# Patient Record
Sex: Female | Born: 1995 | Race: White | Hispanic: No | Marital: Married | Smoking: Never smoker
Health system: Southern US, Community
[De-identification: ages and names within clinical notes are randomized; demographics above are authoritative.]

## PROBLEM LIST (undated history)

## (undated) DIAGNOSIS — E039 Hypothyroidism, unspecified: Secondary | ICD-10-CM

## (undated) DIAGNOSIS — R51 Headache: Secondary | ICD-10-CM

## (undated) HISTORY — PX: NOSE SURGERY: SHX723

## (undated) HISTORY — DX: Headache: R51

---
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
## 2015-03-11 ENCOUNTER — Encounter: Payer: Self-pay | Admitting: Pediatrics

## 2016-05-09 DIAGNOSIS — J343 Hypertrophy of nasal turbinates: Secondary | ICD-10-CM | POA: Diagnosis not present

## 2016-05-09 DIAGNOSIS — M950 Acquired deformity of nose: Secondary | ICD-10-CM | POA: Diagnosis not present

## 2016-05-09 DIAGNOSIS — J342 Deviated nasal septum: Secondary | ICD-10-CM | POA: Diagnosis not present

## 2016-05-11 DIAGNOSIS — J343 Hypertrophy of nasal turbinates: Secondary | ICD-10-CM | POA: Diagnosis not present

## 2016-05-11 DIAGNOSIS — J3489 Other specified disorders of nose and nasal sinuses: Secondary | ICD-10-CM | POA: Diagnosis not present

## 2016-05-11 DIAGNOSIS — S022XXS Fracture of nasal bones, sequela: Secondary | ICD-10-CM | POA: Diagnosis not present

## 2016-05-11 DIAGNOSIS — J342 Deviated nasal septum: Secondary | ICD-10-CM | POA: Diagnosis not present

## 2016-05-11 DIAGNOSIS — M950 Acquired deformity of nose: Secondary | ICD-10-CM | POA: Diagnosis not present

## 2016-12-11 HISTORY — PX: WISDOM TOOTH EXTRACTION: SHX21

## 2016-12-26 DIAGNOSIS — J019 Acute sinusitis, unspecified: Secondary | ICD-10-CM | POA: Diagnosis not present

## 2016-12-26 DIAGNOSIS — Z6824 Body mass index (BMI) 24.0-24.9, adult: Secondary | ICD-10-CM | POA: Diagnosis not present

## 2018-10-17 DIAGNOSIS — Z6825 Body mass index (BMI) 25.0-25.9, adult: Secondary | ICD-10-CM | POA: Diagnosis not present

## 2018-10-17 DIAGNOSIS — Z01419 Encounter for gynecological examination (general) (routine) without abnormal findings: Secondary | ICD-10-CM | POA: Diagnosis not present

## 2019-07-15 DIAGNOSIS — R309 Painful micturition, unspecified: Secondary | ICD-10-CM | POA: Diagnosis not present

## 2019-07-15 DIAGNOSIS — Z03818 Encounter for observation for suspected exposure to other biological agents ruled out: Secondary | ICD-10-CM | POA: Diagnosis not present

## 2019-07-15 DIAGNOSIS — R300 Dysuria: Secondary | ICD-10-CM | POA: Diagnosis not present

## 2019-07-15 DIAGNOSIS — B373 Candidiasis of vulva and vagina: Secondary | ICD-10-CM | POA: Diagnosis not present

## 2019-07-15 DIAGNOSIS — U071 COVID-19: Secondary | ICD-10-CM | POA: Diagnosis not present

## 2019-07-21 DIAGNOSIS — Z20828 Contact with and (suspected) exposure to other viral communicable diseases: Secondary | ICD-10-CM | POA: Diagnosis not present

## 2019-07-23 DIAGNOSIS — U071 COVID-19: Secondary | ICD-10-CM | POA: Diagnosis not present

## 2019-10-10 DIAGNOSIS — L700 Acne vulgaris: Secondary | ICD-10-CM | POA: Diagnosis not present

## 2019-10-10 DIAGNOSIS — D485 Neoplasm of uncertain behavior of skin: Secondary | ICD-10-CM | POA: Diagnosis not present

## 2019-10-27 DIAGNOSIS — Z113 Encounter for screening for infections with a predominantly sexual mode of transmission: Secondary | ICD-10-CM | POA: Diagnosis not present

## 2019-10-27 DIAGNOSIS — Z01419 Encounter for gynecological examination (general) (routine) without abnormal findings: Secondary | ICD-10-CM | POA: Diagnosis not present

## 2019-10-27 DIAGNOSIS — Z6825 Body mass index (BMI) 25.0-25.9, adult: Secondary | ICD-10-CM | POA: Diagnosis not present

## 2019-10-29 DIAGNOSIS — Z1321 Encounter for screening for nutritional disorder: Secondary | ICD-10-CM | POA: Diagnosis not present

## 2019-10-29 DIAGNOSIS — Z13220 Encounter for screening for lipoid disorders: Secondary | ICD-10-CM | POA: Diagnosis not present

## 2019-10-29 DIAGNOSIS — Z1329 Encounter for screening for other suspected endocrine disorder: Secondary | ICD-10-CM | POA: Diagnosis not present

## 2019-10-29 DIAGNOSIS — Z13228 Encounter for screening for other metabolic disorders: Secondary | ICD-10-CM | POA: Diagnosis not present

## 2019-12-15 DIAGNOSIS — E039 Hypothyroidism, unspecified: Secondary | ICD-10-CM | POA: Diagnosis not present

## 2020-03-20 DIAGNOSIS — Z20828 Contact with and (suspected) exposure to other viral communicable diseases: Secondary | ICD-10-CM | POA: Diagnosis not present

## 2020-03-20 DIAGNOSIS — Z03818 Encounter for observation for suspected exposure to other biological agents ruled out: Secondary | ICD-10-CM | POA: Diagnosis not present

## 2020-04-20 DIAGNOSIS — R079 Chest pain, unspecified: Secondary | ICD-10-CM | POA: Diagnosis not present

## 2020-04-20 DIAGNOSIS — Z6825 Body mass index (BMI) 25.0-25.9, adult: Secondary | ICD-10-CM | POA: Diagnosis not present

## 2020-04-20 DIAGNOSIS — K219 Gastro-esophageal reflux disease without esophagitis: Secondary | ICD-10-CM | POA: Diagnosis not present

## 2020-04-20 DIAGNOSIS — Z23 Encounter for immunization: Secondary | ICD-10-CM | POA: Diagnosis not present

## 2020-06-09 DIAGNOSIS — L739 Follicular disorder, unspecified: Secondary | ICD-10-CM | POA: Diagnosis not present

## 2020-06-09 DIAGNOSIS — L700 Acne vulgaris: Secondary | ICD-10-CM | POA: Diagnosis not present

## 2020-06-29 DIAGNOSIS — Z03818 Encounter for observation for suspected exposure to other biological agents ruled out: Secondary | ICD-10-CM | POA: Diagnosis not present

## 2020-06-29 DIAGNOSIS — Z20822 Contact with and (suspected) exposure to covid-19: Secondary | ICD-10-CM | POA: Diagnosis not present

## 2020-08-11 DIAGNOSIS — L700 Acne vulgaris: Secondary | ICD-10-CM | POA: Diagnosis not present

## 2020-09-09 DIAGNOSIS — R1313 Dysphagia, pharyngeal phase: Secondary | ICD-10-CM | POA: Diagnosis not present

## 2020-09-09 DIAGNOSIS — E039 Hypothyroidism, unspecified: Secondary | ICD-10-CM | POA: Diagnosis not present

## 2020-09-09 DIAGNOSIS — Z6826 Body mass index (BMI) 26.0-26.9, adult: Secondary | ICD-10-CM | POA: Diagnosis not present

## 2020-09-10 ENCOUNTER — Other Ambulatory Visit: Payer: Self-pay | Admitting: Nurse Practitioner

## 2020-09-10 DIAGNOSIS — R1310 Dysphagia, unspecified: Secondary | ICD-10-CM

## 2020-09-10 DIAGNOSIS — E039 Hypothyroidism, unspecified: Secondary | ICD-10-CM

## 2020-09-10 DIAGNOSIS — R1313 Dysphagia, pharyngeal phase: Secondary | ICD-10-CM

## 2020-09-17 ENCOUNTER — Other Ambulatory Visit: Payer: Self-pay

## 2020-09-23 ENCOUNTER — Ambulatory Visit
Admission: RE | Admit: 2020-09-23 | Discharge: 2020-09-23 | Disposition: A | Discharge status: Home / Self Care | Payer: BC Managed Care – PPO | Source: Ambulatory Visit | Attending: Nurse Practitioner | Admitting: Nurse Practitioner

## 2020-09-23 DIAGNOSIS — R1310 Dysphagia, unspecified: Secondary | ICD-10-CM

## 2020-09-23 DIAGNOSIS — R1313 Dysphagia, pharyngeal phase: Secondary | ICD-10-CM

## 2020-09-23 DIAGNOSIS — E039 Hypothyroidism, unspecified: Secondary | ICD-10-CM

## 2020-12-11 NOTE — L&D Delivery Note (Signed)
Delivery Note  SVD viable female Apgars 8,9 over posterior vaginal laceration.  Placenta delivered spontaneously intact with 3VC. Repair with 2-0 Chromic with good support and hemostasis noted.  R/V exam confirms.  PH art was not sent.   Mother and baby to couplet care and are doing well.  EBL 100cc  Candice Camp, MD

## 2021-03-23 LAB — OB RESULTS CONSOLE HIV ANTIBODY (ROUTINE TESTING): HIV: NONREACTIVE

## 2021-03-23 LAB — OB RESULTS CONSOLE HEPATITIS B SURFACE ANTIGEN: Hepatitis B Surface Ag: NEGATIVE

## 2021-03-23 LAB — OB RESULTS CONSOLE RUBELLA ANTIBODY, IGM: Rubella: IMMUNE

## 2021-10-04 LAB — OB RESULTS CONSOLE GBS: GBS: NEGATIVE

## 2021-10-22 ENCOUNTER — Inpatient Hospital Stay (HOSPITAL_COMMUNITY)
Admission: AD | Admit: 2021-10-22 | Discharge: 2021-10-24 | DRG: 807 | Disposition: A | Discharge status: Home / Self Care | Payer: BLUE CROSS/BLUE SHIELD | Attending: Obstetrics and Gynecology | Admitting: Obstetrics and Gynecology

## 2021-10-22 ENCOUNTER — Other Ambulatory Visit: Payer: Self-pay

## 2021-10-22 ENCOUNTER — Encounter (HOSPITAL_COMMUNITY): Payer: Self-pay | Admitting: Obstetrics and Gynecology

## 2021-10-22 ENCOUNTER — Inpatient Hospital Stay (HOSPITAL_COMMUNITY): Payer: BLUE CROSS/BLUE SHIELD | Admitting: Anesthesiology

## 2021-10-22 DIAGNOSIS — Z3689 Encounter for other specified antenatal screening: Secondary | ICD-10-CM | POA: Diagnosis not present

## 2021-10-22 DIAGNOSIS — O99284 Endocrine, nutritional and metabolic diseases complicating childbirth: Secondary | ICD-10-CM | POA: Diagnosis present

## 2021-10-22 DIAGNOSIS — Z20822 Contact with and (suspected) exposure to covid-19: Secondary | ICD-10-CM | POA: Diagnosis present

## 2021-10-22 DIAGNOSIS — Z3A39 39 weeks gestation of pregnancy: Secondary | ICD-10-CM

## 2021-10-22 DIAGNOSIS — Z3490 Encounter for supervision of normal pregnancy, unspecified, unspecified trimester: Secondary | ICD-10-CM

## 2021-10-22 DIAGNOSIS — E039 Hypothyroidism, unspecified: Secondary | ICD-10-CM | POA: Diagnosis present

## 2021-10-22 DIAGNOSIS — O26893 Other specified pregnancy related conditions, third trimester: Secondary | ICD-10-CM | POA: Diagnosis present

## 2021-10-22 HISTORY — DX: Hypothyroidism, unspecified: E03.9

## 2021-10-22 LAB — RESP PANEL BY RT-PCR (FLU A&B, COVID) ARPGX2
Influenza A by PCR: NEGATIVE
Influenza B by PCR: NEGATIVE
SARS Coronavirus 2 by RT PCR: NEGATIVE

## 2021-10-22 LAB — CBC
HCT: 33.6 % — ABNORMAL LOW (ref 36.0–46.0)
Hemoglobin: 10.6 g/dL — ABNORMAL LOW (ref 12.0–15.0)
MCH: 28.7 pg (ref 26.0–34.0)
MCHC: 31.5 g/dL (ref 30.0–36.0)
MCV: 91.1 fL (ref 80.0–100.0)
Platelets: 319 10*3/uL (ref 150–400)
RBC: 3.69 MIL/uL — ABNORMAL LOW (ref 3.87–5.11)
RDW: 12.6 % (ref 11.5–15.5)
WBC: 14.3 10*3/uL — ABNORMAL HIGH (ref 4.0–10.5)
nRBC: 0 % (ref 0.0–0.2)

## 2021-10-22 LAB — TYPE AND SCREEN
ABO/RH(D): A POS
Antibody Screen: NEGATIVE

## 2021-10-22 LAB — RPR: RPR Ser Ql: NONREACTIVE

## 2021-10-22 MED ORDER — LEVOTHYROXINE SODIUM 100 MCG PO TABS
100.0000 ug | ORAL_TABLET | Freq: Every day | ORAL | Status: DC
Start: 2021-10-23 — End: 2021-10-24
  Administered 2021-10-23 – 2021-10-24 (×2): 100 ug via ORAL
  Filled 2021-10-22 (×2): qty 1

## 2021-10-22 MED ORDER — DIPHENHYDRAMINE HCL 50 MG/ML IJ SOLN: 12.5000 mg | INTRAMUSCULAR | Status: DC | PRN

## 2021-10-22 MED ORDER — ACETAMINOPHEN 325 MG PO TABS
650.0000 mg | ORAL_TABLET | ORAL | Status: DC | PRN
  Administered 2021-10-23 (×4): 650 mg via ORAL
  Filled 2021-10-22 (×4): qty 2

## 2021-10-22 MED ORDER — OXYCODONE-ACETAMINOPHEN 5-325 MG PO TABS: 2.0000 | ORAL_TABLET | ORAL | Status: DC | PRN

## 2021-10-22 MED ORDER — IBUPROFEN 600 MG PO TABS
600.0000 mg | ORAL_TABLET | Freq: Four times a day (QID) | ORAL | Status: DC
  Administered 2021-10-22 – 2021-10-24 (×6): 600 mg via ORAL
  Filled 2021-10-22 (×6): qty 1

## 2021-10-22 MED ORDER — WITCH HAZEL-GLYCERIN EX PADS: 1.0000 "application " | MEDICATED_PAD | CUTANEOUS | Status: DC | PRN

## 2021-10-22 MED ORDER — TERBUTALINE SULFATE 1 MG/ML IJ SOLN: 0.2500 mg | Freq: Once | INTRAMUSCULAR | Status: DC | PRN

## 2021-10-22 MED ORDER — PRENATAL MULTIVITAMIN CH
1.0000 | ORAL_TABLET | Freq: Every day | ORAL | Status: DC
  Administered 2021-10-23: 1 via ORAL
  Filled 2021-10-22: qty 1

## 2021-10-22 MED ORDER — FENTANYL-BUPIVACAINE-NACL 0.5-0.125-0.9 MG/250ML-% EP SOLN
12.0000 mL/h | EPIDURAL | Status: DC | PRN
  Filled 2021-10-22: qty 250

## 2021-10-22 MED ORDER — LACTATED RINGERS IV SOLN
500.0000 mL | INTRAVENOUS | Status: DC | PRN
  Administered 2021-10-22 (×2): 500 mL via INTRAVENOUS

## 2021-10-22 MED ORDER — LIDOCAINE HCL (PF) 1 % IJ SOLN
INTRAMUSCULAR | Status: DC | PRN
Start: 2021-10-22 — End: 2021-10-22
  Administered 2021-10-22 (×2): 5 mL via EPIDURAL

## 2021-10-22 MED ORDER — MEDROXYPROGESTERONE ACETATE 150 MG/ML IM SUSP: 150.0000 mg | INTRAMUSCULAR | Status: DC | PRN

## 2021-10-22 MED ORDER — PHENYLEPHRINE 40 MCG/ML (10ML) SYRINGE FOR IV PUSH (FOR BLOOD PRESSURE SUPPORT)
80.0000 ug | PREFILLED_SYRINGE | INTRAVENOUS | Status: DC | PRN
  Filled 2021-10-22: qty 10

## 2021-10-22 MED ORDER — LIDOCAINE HCL (PF) 1 % IJ SOLN: 30.0000 mL | INTRAMUSCULAR | Status: DC | PRN

## 2021-10-22 MED ORDER — SIMETHICONE 80 MG PO CHEW: 80.0000 mg | CHEWABLE_TABLET | ORAL | Status: DC | PRN

## 2021-10-22 MED ORDER — EPHEDRINE 5 MG/ML INJ: 10.0000 mg | INTRAVENOUS | Status: DC | PRN

## 2021-10-22 MED ORDER — FLEET ENEMA 7-19 GM/118ML RE ENEM: 1.0000 | ENEMA | RECTAL | Status: DC | PRN

## 2021-10-22 MED ORDER — COCONUT OIL OIL: 1.0000 "application " | TOPICAL_OIL | Status: DC | PRN

## 2021-10-22 MED ORDER — OXYTOCIN-SODIUM CHLORIDE 30-0.9 UT/500ML-% IV SOLN: 2.5000 [IU]/h | INTRAVENOUS | Status: DC

## 2021-10-22 MED ORDER — ONDANSETRON HCL 4 MG PO TABS: 4.0000 mg | ORAL_TABLET | ORAL | Status: DC | PRN

## 2021-10-22 MED ORDER — OXYTOCIN-SODIUM CHLORIDE 30-0.9 UT/500ML-% IV SOLN
1.0000 m[IU]/min | INTRAVENOUS | Status: DC
  Administered 2021-10-22: 2 m[IU]/min via INTRAVENOUS
  Filled 2021-10-22: qty 500

## 2021-10-22 MED ORDER — BENZOCAINE-MENTHOL 20-0.5 % EX AERO
1.0000 "application " | INHALATION_SPRAY | CUTANEOUS | Status: DC | PRN
  Administered 2021-10-23: 1 via TOPICAL
  Filled 2021-10-22: qty 56

## 2021-10-22 MED ORDER — SENNOSIDES-DOCUSATE SODIUM 8.6-50 MG PO TABS
2.0000 | ORAL_TABLET | Freq: Every day | ORAL | Status: DC
  Administered 2021-10-23: 2 via ORAL
  Filled 2021-10-22: qty 2

## 2021-10-22 MED ORDER — ONDANSETRON HCL 4 MG/2ML IJ SOLN: 4.0000 mg | INTRAMUSCULAR | Status: DC | PRN

## 2021-10-22 MED ORDER — TETANUS-DIPHTH-ACELL PERTUSSIS 5-2.5-18.5 LF-MCG/0.5 IM SUSY: 0.5000 mL | PREFILLED_SYRINGE | Freq: Once | INTRAMUSCULAR | Status: DC

## 2021-10-22 MED ORDER — LACTATED RINGERS IV SOLN
500.0000 mL | Freq: Once | INTRAVENOUS | Status: AC
  Administered 2021-10-22: 500 mL via INTRAVENOUS

## 2021-10-22 MED ORDER — OXYCODONE-ACETAMINOPHEN 5-325 MG PO TABS: 1.0000 | ORAL_TABLET | ORAL | Status: DC | PRN

## 2021-10-22 MED ORDER — LACTATED RINGERS IV SOLN: INTRAVENOUS | Status: DC

## 2021-10-22 MED ORDER — SOD CITRATE-CITRIC ACID 500-334 MG/5ML PO SOLN: 30.0000 mL | ORAL | Status: DC | PRN

## 2021-10-22 MED ORDER — OXYTOCIN BOLUS FROM INFUSION
333.0000 mL | Freq: Once | INTRAVENOUS | Status: AC
  Administered 2021-10-22: 333 mL via INTRAVENOUS

## 2021-10-22 MED ORDER — FENTANYL CITRATE (PF) 100 MCG/2ML IJ SOLN: 50.0000 ug | INTRAMUSCULAR | Status: DC | PRN

## 2021-10-22 MED ORDER — ACETAMINOPHEN 325 MG PO TABS: 650.0000 mg | ORAL_TABLET | ORAL | Status: DC | PRN

## 2021-10-22 MED ORDER — OXYCODONE-ACETAMINOPHEN 5-325 MG PO TABS
1.0000 | ORAL_TABLET | ORAL | Status: DC | PRN
  Administered 2021-10-22: 1 via ORAL
  Filled 2021-10-22: qty 1

## 2021-10-22 MED ORDER — ZOLPIDEM TARTRATE 5 MG PO TABS: 5.0000 mg | ORAL_TABLET | Freq: Every evening | ORAL | Status: DC | PRN

## 2021-10-22 MED ORDER — ONDANSETRON HCL 4 MG/2ML IJ SOLN
4.0000 mg | Freq: Four times a day (QID) | INTRAMUSCULAR | Status: DC | PRN
  Administered 2021-10-22: 4 mg via INTRAVENOUS
  Filled 2021-10-22: qty 2

## 2021-10-22 MED ORDER — DIBUCAINE (PERIANAL) 1 % EX OINT: 1.0000 "application " | TOPICAL_OINTMENT | CUTANEOUS | Status: DC | PRN

## 2021-10-22 MED ORDER — MEASLES, MUMPS & RUBELLA VAC IJ SOLR: 0.5000 mL | Freq: Once | INTRAMUSCULAR | Status: DC

## 2021-10-22 MED ORDER — PHENYLEPHRINE 40 MCG/ML (10ML) SYRINGE FOR IV PUSH (FOR BLOOD PRESSURE SUPPORT): 80.0000 ug | PREFILLED_SYRINGE | INTRAVENOUS | Status: DC | PRN

## 2021-10-22 MED ORDER — DIPHENHYDRAMINE HCL 25 MG PO CAPS: 25.0000 mg | ORAL_CAPSULE | Freq: Four times a day (QID) | ORAL | Status: DC | PRN

## 2021-10-22 NOTE — Plan of Care (Signed)

## 2021-10-22 NOTE — MAU Provider Note (Signed)
Chief Complaint:  Contractions   None     HPI: Cristina Jackson is a 25 y.o. G1P0 at [redacted]w[redacted]d who presents to maternity admissions for labor evaluation. She reports contractions for 48 hours that are getting progressively stronger.  She reports good fetal movement.    She has moved to 3 hours from [place] and is concerned about long drive home if she has to return later.   HPI  Past Medical History: Past Medical History:  Diagnosis Date   Headache(784.0)    Hypothyroidism     Past obstetric history: OB History  Gravida Para Term Preterm AB Living  1            SAB IAB Ectopic Multiple Live Births               # Outcome Date GA Lbr Len/2nd Weight Sex Delivery Anes PTL Lv  1 Current             Past Surgical History: Past Surgical History:  Procedure Laterality Date   ARTHROSCOPIC REPAIR ACL  04/10/2010   Dr. Thomasena Edis   NOSE SURGERY     WISDOM TOOTH EXTRACTION  2018    Family History: Family History  Problem Relation Age of Onset   Lymphoma Paternal Grandfather    Emphysema Maternal Grandfather        Died at 33    Social History: Social History   Tobacco Use   Smoking status: Never   Smokeless tobacco: Never  Substance Use Topics   Alcohol use: No   Drug use: No    Allergies: No Known Allergies  Meds:  Medications Prior to Admission  Medication Sig Dispense Refill Last Dose   levothyroxine (SYNTHROID) 100 MCG tablet Take 100 mcg by mouth daily before breakfast.   10/21/2021   Prenatal Vit-Fe Fumarate-FA (PRENATAL MULTIVITAMIN) TABS tablet Take 1 tablet by mouth daily at 12 noon.   10/21/2021   acetaminophen (TYLENOL) 500 MG tablet Take 500 mg by mouth every 6 (six) hours as needed for headache (Take 2 by mouth every 6 hours as needed for pain.).      ibuprofen (ADVIL,MOTRIN) 200 MG tablet Take 200 mg by mouth every 6 (six) hours as needed for headache (Take 2 by mouth every 6 hours as needed for pain.).      norgestrel-ethinyl estradiol  (LO/OVRAL,CRYSELLE) 0.3-30 MG-MCG tablet Take 1 tablet by mouth daily.      ondansetron (ZOFRAN-ODT) 4 MG disintegrating tablet Take 1 tablet (4 mg total) by mouth every 8 (eight) hours as needed for nausea or vomiting. 20 tablet 0     ROS:  Review of Systems  Constitutional:  Negative for chills, fatigue and fever.  Eyes:  Negative for visual disturbance.  Respiratory:  Negative for shortness of breath.   Cardiovascular:  Negative for chest pain.  Gastrointestinal:  Positive for abdominal pain. Negative for nausea and vomiting.  Genitourinary:  Negative for difficulty urinating, dysuria, flank pain, pelvic pain, vaginal bleeding, vaginal discharge and vaginal pain.  Musculoskeletal:  Positive for back pain.  Neurological:  Negative for dizziness and headaches.  Psychiatric/Behavioral: Negative.      I have reviewed patient's Past Medical Hx, Surgical Hx, Family Hx, Social Hx, medications and allergies.   Physical Exam  Patient Vitals for the past 24 hrs:  BP Temp Temp src Pulse Resp SpO2  10/22/21 0718 (!) 88/46 -- -- 62 -- --  10/22/21 0408 118/72 -- -- 72 -- --  10/22/21 0405 -- -- -- -- --  98 %  10/22/21 0400 -- -- -- -- -- 97 %  10/22/21 0355 117/74 98.3 F (36.8 C) Oral 75 18 98 %   Constitutional: Well-developed, well-nourished female in no acute distress.  Cardiovascular: normal rate Respiratory: normal effort GI: Abd soft, non-tender, gravid appropriate for gestational age.  MS: Extremities nontender, no edema, normal ROM Neurologic: Alert and oriented x 4.  GU: Neg CVAT.  Cervix 2/70/-2 on initial exam by RN In 1.5 hours pt did not make cervical change but with more regular contractions and pt report of increased pain with contractions , plan to recheck in 1-1.5 hours.  Cervix 3/70/-2 on recheck.   Plan to recheck in 1 hour:  Dilation: 3 Effacement (%): 80 Cervical Position: Posterior Station: -2 Presentation: Vertex Exam by:: [employer] RN  FHT:   Baseline 135 , moderate variability, accelerations present, no decelerations Contractions: q 2-3 mins   Labs: No results found for this or any previous visit (from the past 24 hour(s)).    Imaging:  No results found.  MAU Course/MDM: Orders Placed This Encounter  Procedures   Resp Panel by RT-PCR (Flu A&B, Covid) Nasopharyngeal Swab   Contraction - monitoring   External fetal heart monitoring   Vaginal exam   Admit to Inpatient (patient's expected length of stay will be greater than 2 midnights or inpatient only procedure)    No orders of the defined types were placed in this encounter.    NST reviewed and reactive Pt with no cervical change on reexamination in 1.5 hours but given the long distance she is from the hospital, plan to reexamine On recheck, pt with small amount of cervical change to 3 cm but much more uncomfortable, moaning with contractions Plan to recheck in 1 hour, likely beginning active labor On recheck, pt more effaced at 80%, so called Dr Corinna Capra and pt admitted for labor, and augmentation if needed.    Fatima Blank Certified Nurse-Midwife 10/22/2021 8:22 AM

## 2021-10-22 NOTE — Lactation Note (Signed)
This note was copied from a baby's chart. Lactation Consultation Note  Patient Name: Cristina Jackson Date: 10/22/2021 Reason for consult: L&D Initial assessment;Primapara;1st time breastfeeding;Term Age:25 hours  L&D consult with >30 minutes old infant and P1 mother. Congratulated family on newborn. Baby is latched, football position, upon arrival. Transitioned to laid back position successfully. Spoonfed ~2 mL of expressed colostrum.  Discussed STS as ideal transition for infants after birth. Talked about primal reflexes. Explained LC services availability during postpartum stay. Thanked family for their time.      Maternal Data Has patient been taught Hand Expression?: Yes Does the patient have breastfeeding experience prior to this delivery?: No  Feeding Mother's Current Feeding Choice: Breast Milk  LATCH Score Latch: Grasps breast easily, tongue down, lips flanged, rhythmical sucking.  Audible Swallowing: Spontaneous and intermittent  Type of Nipple: Everted at rest and after stimulation  Comfort (Breast/Nipple): Soft / non-tender  Hold (Positioning): Assistance needed to correctly position infant at breast and maintain latch.  LATCH Score: 9  Interventions Interventions: Breast feeding basics reviewed;Assisted with latch;Skin to skin;Breast massage;Hand express;Expressed milk;Education  Discharge WIC Program: No  Consult Status Consult Status: Follow-up from L&D    [name] 10/22/2021, 8:12 PM

## 2021-10-22 NOTE — Plan of Care (Signed)

## 2021-10-22 NOTE — MAU Note (Signed)
Pt present to MAU with CTX every 6-7 mins w/ cramping since 1230 this am, pain rating 5/10. Pt denies DFM, VB, LOF, PIH s/s, and abnormal discharge. Last SVE: 1.5cm.  GBS -

## 2021-10-22 NOTE — H&P (Signed)
Cristina Jackson is a 25 y.o. female presenting for active labor.  PRegnancy uncomplicated. GBS neg. OB History     Gravida  1   Para      Term      Preterm      AB      Living         SAB      IAB      Ectopic      Multiple      Live Births             Past Medical History:  Diagnosis Date   Headache(784.0)    Hypothyroidism    Past Surgical History:  Procedure Laterality Date   ARTHROSCOPIC REPAIR ACL  04/10/2010   Dr. Thomasena Edis   NOSE SURGERY     WISDOM TOOTH EXTRACTION  2018   Family History: family history includes Emphysema in her maternal grandfather; Lymphoma in her paternal grandfather. Social History:  reports that she has never smoked. She has never used smokeless tobacco. She reports that she does not drink alcohol and does not use drugs.     Maternal Diabetes: No Genetic Screening: Normal Maternal Ultrasounds/Referrals: Normal Fetal Ultrasounds or other Referrals:  None Maternal Substance Abuse:  No Significant Maternal Medications:  None Significant Maternal Lab Results:  Group B Strep negative Other Comments:  None  Review of Systems History Dilation: 4 Effacement (%): 100 Station: -2 Exam by:: Dr. Rana Snare Blood pressure 98/63, pulse 60, temperature 98.3 F (36.8 C), temperature source Oral, resp. rate 18, SpO2 98 %. Exam Physical Exam  Vitals and nursing note reviewed. Exam conducted with a chaperone present.  Constitutional:      Appearance: Normal appearance.  HENT:     Head: Normocephalic.  Eyes:     Pupils: Pupils are equal, round, and reactive to light.  Cardiovascular:     Rate and Rhythm: Normal rate and regular rhythm.     Pulses: Normal pulses.  Abdominal:     General: Abdomen is Gravid, nontender Neurological:     Mental Status: She is alert.  Prenatal labs: ABO, Rh:   Antibody:   Rubella:   RPR:    HBsAg:    HIV:    GBS:     Assessment/Plan: IUP at term Active labor AROM and anticipate CLEA and  SVD   Turner Daniels 10/22/2021, 9:26 AM

## 2021-10-22 NOTE — Anesthesia Procedure Notes (Addendum)
Epidural Patient location during procedure: OB Start time: 10/22/2021 10:11 AM End time: 10/22/2021 10:29 AM  Staffing Anesthesiologist: Lucretia Kern, MD Performed: anesthesiologist   Preanesthetic Checklist Completed: patient identified, IV checked, risks and benefits discussed, monitors and equipment checked, pre-op evaluation and timeout performed  Epidural Patient position: sitting Prep: DuraPrep Patient monitoring: heart rate, continuous pulse ox and blood pressure Approach: midline Location: L3-L4 Injection technique: LOR air  Needle:  Needle type: Tuohy  Needle gauge: 17 G Needle length: 9 cm Needle insertion depth: 5 cm Catheter type: closed end flexible Catheter size: 19 Gauge Catheter at skin depth: 10 cm Test dose: negative  Assessment Events: blood aspirated, injection not painful, no injection resistance, no paresthesia and negative IV test  Additional Notes Per patient she has been diagnosed with mild scoliosis on physical exam. During my palpation, her lumbar spine felt left of the apparent midline. Initial attempt with smooth needle placement and crisp LOR. The needle was bolused with 1% lidocaine and patient complained of crampy back and abdominal pain. The catheter threaded very easily, but I was able to aspirate serosanguinous fluid. I removed the catheter and reattempted at a slightly different entry point. Again smooth needle placement and crisp LOR. Catheter threaded easily and no complaints from patient. No fluid aspirated via catheter. Bolused in incremental doses with no adverse effects.Reason for block:procedure for pain

## 2021-10-22 NOTE — Anesthesia Preprocedure Evaluation (Signed)
Anesthesia Evaluation  Patient identified by MRN, date of birth, ID band Patient awake    Reviewed: Allergy & Precautions, H&P , NPO status , Patient's Chart, lab work & pertinent test results  History of Anesthesia Complications Negative for: history of anesthetic complications  Airway Mallampati: II  TM Distance: >3 FB     Dental   Pulmonary neg pulmonary ROS,    Pulmonary exam normal        Cardiovascular negative cardio ROS   Rhythm:regular Rate:Normal     Neuro/Psych negative neurological ROS  negative psych ROS   GI/Hepatic negative GI ROS, Neg liver ROS,   Endo/Other  Hypothyroidism   Renal/GU negative Renal ROS  negative genitourinary   Musculoskeletal scoliosis   Abdominal   Peds  Hematology negative hematology ROS (+)   Anesthesia Other Findings   Reproductive/Obstetrics (+) Pregnancy                             Anesthesia Physical Anesthesia Plan  ASA: 2  Anesthesia Plan: Epidural   Post-op Pain Management:    Induction:   PONV Risk Score and Plan:   Airway Management Planned:   Additional Equipment:   Intra-op Plan:   Post-operative Plan:   Informed Consent: I have reviewed the patients History and Physical, chart, labs and discussed the procedure including the risks, benefits and alternatives for the proposed anesthesia with the patient or authorized representative who has indicated his/her understanding and acceptance.       Plan Discussed with:   Anesthesia Plan Comments:         Anesthesia Quick Evaluation

## 2021-10-23 LAB — CBC
HCT: 28.2 % — ABNORMAL LOW (ref 36.0–46.0)
Hemoglobin: 9.4 g/dL — ABNORMAL LOW (ref 12.0–15.0)
MCH: 29.7 pg (ref 26.0–34.0)
MCHC: 33.3 g/dL (ref 30.0–36.0)
MCV: 89 fL (ref 80.0–100.0)
Platelets: 240 10*3/uL (ref 150–400)
RBC: 3.17 MIL/uL — ABNORMAL LOW (ref 3.87–5.11)
RDW: 12.6 % (ref 11.5–15.5)
WBC: 16.5 10*3/uL — ABNORMAL HIGH (ref 4.0–10.5)
nRBC: 0 % (ref 0.0–0.2)

## 2021-10-23 NOTE — Progress Notes (Signed)
Post Partum Day 1 Subjective: no complaints, up ad lib, voiding, and tolerating PO  Objective: Blood pressure 104/74, pulse 71, temperature 98.2 F (36.8 C), temperature source Oral, resp. rate 18, height 5\' 4"  (1.626 m), weight 73.5 kg, SpO2 97 %, unknown if currently breastfeeding.  Physical Exam:  General: alert, cooperative, and appears stated age Lochia: appropriate Uterine Fundus: firm Incision:   DVT Evaluation: No evidence of DVT seen on physical exam.  Recent Labs    10/22/21 0906 10/23/21 0427  HGB 10.6* 9.4*  HCT 33.6* 28.2*    Assessment/Plan: Plan for discharge tomorrow and Breastfeeding   LOS: 1 day   10/25/21 10/23/2021, 9:55 AM

## 2021-10-23 NOTE — Lactation Note (Signed)
This note was copied from a baby's chart. Lactation Consultation Note  Patient Name: Cristina Jackson [id number] Date: 10/23/2021 Reason for consult: Initial assessment;Primapara;1st time breastfeeding;Term Age:25 hours   P1 mother whose infant is now 42 hours old.  This is a term baby at 39+1 weeks.  RN in room when I arrived.  Mother just finished breast feeding.  No questions at this time.  Asked her to call me back as the day progresses for education and to assist with latching.  Mother receptive.   Maternal Data    Feeding Mother's Current Feeding Choice: Breast Milk  LATCH Score                    Lactation Tools Discussed/Used    Interventions    Discharge    Consult Status Consult Status: Follow-up Date: 10/24/21 Follow-up type: In-patient    Cristina Jackson 10/23/2021, 5:29 AM

## 2021-10-23 NOTE — Anesthesia Postprocedure Evaluation (Signed)
Anesthesia Post Note  Patient: OLEDA BORSKI  Procedure(s) Performed: AN AD HOC LABOR EPIDURAL     Patient location during evaluation: Mother Baby Anesthesia Type: Epidural Level of consciousness: awake, oriented and awake and alert Pain management: pain level controlled Vital Signs Assessment: post-procedure vital signs reviewed and stable Respiratory status: spontaneous breathing, respiratory function stable and nonlabored ventilation Cardiovascular status: stable Postop Assessment: no headache, adequate PO intake, able to ambulate, patient able to bend at knees, no backache and no apparent nausea or vomiting Anesthetic complications: no   No notable events documented.  Last Vitals:  Vitals:   10/23/21 0200 10/23/21 0525  BP: 91/61 104/74  Pulse: 65 71  Resp: 15 18  Temp: 36.9 C 36.8 C  SpO2: 97% 97%    Last Pain:  Vitals:   10/23/21 0525  TempSrc: Oral  PainSc: 3    Pain Goal: Patients Stated Pain Goal: 0 (10/22/21 1346)                 Lacoya Wilbanks

## 2021-10-24 NOTE — Lactation Note (Signed)
This note was copied from a baby's chart. Lactation Consultation Note  Patient Name: Girl Adonai Helzer [id number] Date: 10/24/2021 Reason for consult: Follow-up assessment;Term;Primapara;1st time breastfeeding Age:25 hours   P1 mother whose infant is now 54 hours old.  This is a term baby at 39+1 weeks.  Mother was finishing a breast feeding session with baby "Rennis Chris" when I arrived.  She appeared to have a good wide gape, flanged lips and was actively sucking.  Mother denied pain and feels like breast feeding is going well.  Last LATCH score-9 ; voiding/stooling well.  Manual pump given with instructions for use.  #24 flange size is appropriate.  She will continue to feed on cue and will call our OP number with any concerns after discharge.  Mother will work from home and has a DEBP for home use.  Father and visitor present.   Maternal Data    Feeding Mother's Current Feeding Choice: Breast Milk  LATCH Score                    Lactation Tools Discussed/Used    Interventions Interventions: Education  Discharge Discharge Education: Engorgement and breast care Pump: Personal  Consult Status Consult Status: Complete Date: 10/24/21 Follow-up type: Call as needed    Irene Pap Abshir Paolini 10/24/2021, 9:31 AM

## 2021-10-24 NOTE — Discharge Summary (Signed)
Postpartum Discharge Summary  Date of Service October 24, 2021     Patient Name: Cristina Jackson DOB: November 24, 1996 MRN: [id number]  Date of admission: 10/22/2021 Delivery date:10/22/2021  Delivering provider: Louretta Shorten  Date of discharge: 10/24/2021  Admitting diagnosis: Normal labor [O80, Z37.9] Pregnancy [Z34.90] Intrauterine pregnancy: [redacted]w[redacted]d    Secondary diagnosis:  Active Problems:   Normal labor   Pregnancy  Additional problems: none    Discharge diagnosis: Term Pregnancy Delivered                                              Post partum procedures: not applicable Augmentation: AROM and Pitocin Complications: None  Hospital course: Onset of Labor With Vaginal Delivery      25y.o. yo G1P1001 at 318w1das admitted in Active Labor on 10/22/2021. Patient had an uncomplicated labor course as follows:  Membrane Rupture Time/Date: 9:24 AM ,10/22/2021   Delivery Method:Vaginal, Spontaneous  Episiotomy: None  Lacerations:  Vaginal  Patient had an uncomplicated postpartum course.  She is ambulating, tolerating a regular diet, passing flatus, and urinating well. Patient is discharged home in stable condition on 10/24/21.  Newborn Data: Birth date:10/22/2021  Birth time:7:23 PM  Gender:Female  Living status:Living  Apgars:8 ,9  Weight:3500 g   Magnesium Sulfate received: No BMZ received: No Rhophylac:N/A MMR:N/A T-DaP:Given prenatally Flu: N/A Transfusion:No  Physical exam  Vitals:   10/23/21 1015 10/23/21 1408 10/23/21 2112 10/24/21 0525  BP: 98/70 112/79 104/72 101/69  Pulse: 77 72 82 (!) 58  Resp: 20  18 18   Temp: 98 F (36.7 C) (!) 97.5 F (36.4 C) 97.7 F (36.5 C) 98.8 F (37.1 C)  TempSrc: Oral Oral Oral Oral  SpO2: 96% 99% 99% 99%  Weight:      Height:       General: alert, cooperative, and no distress Lochia: appropriate Uterine Fundus: firm Incision: Healing well with no significant drainage DVT Evaluation: No evidence of DVT seen on  physical exam. Labs: Lab Results  Component Value Date   WBC 16.5 (H) 10/23/2021   HGB 9.4 (L) 10/23/2021   HCT 28.2 (L) 10/23/2021   MCV 89.0 10/23/2021   PLT 240 10/23/2021   No flowsheet data found. Edinburgh Score: Edinburgh Postnatal Depression Scale Screening Tool 10/23/2021  I have been able to laugh and see the funny side of things. 0  I have looked forward with enjoyment to things. 0  I have blamed myself unnecessarily when things went wrong. 2  I have been anxious or worried for no good reason. 2  I have felt scared or panicky for no good reason. 1  Things have been getting on top of me. 0  I have been so unhappy that I have had difficulty sleeping. 0  I have felt sad or miserable. 0  I have been so unhappy that I have been crying. 1  The thought of harming myself has occurred to me. 0  Edinburgh Postnatal Depression Scale Total 6      After visit meds:  Allergies as of 10/24/2021   No Known Allergies      Medication List     STOP taking these medications    ondansetron 4 MG disintegrating tablet Commonly known as: ZOFRAN-ODT       TAKE these medications    acetaminophen 500 MG tablet Commonly known  as: TYLENOL Take 500 mg by mouth every 6 (six) hours as needed for headache (Take 2 by mouth every 6 hours as needed for pain.).   ibuprofen 200 MG tablet Commonly known as: ADVIL Take 200 mg by mouth every 6 (six) hours as needed for headache (Take 2 by mouth every 6 hours as needed for pain.).   levothyroxine 100 MCG tablet Commonly known as: SYNTHROID Take 100 mcg by mouth daily before breakfast.   prenatal multivitamin Tabs tablet Take 1 tablet by mouth daily at 12 noon.         Discharge home in stable condition Infant Feeding: Breast Infant Disposition:home with mother Discharge instruction: per After Visit Summary and Postpartum booklet. Activity: Advance as tolerated. Pelvic rest for 6 weeks.  Diet: routine diet Anticipated Birth  Control: Unsure Postpartum Appointment:6 weeks Additional Postpartum F/U:  not applicable Future Appointments:No future appointments. Follow up Visit:      10/24/2021 Cyril Mourning, MD

## 2021-10-31 ENCOUNTER — Inpatient Hospital Stay (HOSPITAL_COMMUNITY)
Admission: AD | Admit: 2021-10-31 | Payer: BC Managed Care – PPO | Source: Home / Self Care | Admitting: Obstetrics & Gynecology

## 2021-10-31 ENCOUNTER — Inpatient Hospital Stay (HOSPITAL_COMMUNITY): Payer: BC Managed Care – PPO

## 2021-11-02 ENCOUNTER — Telehealth (HOSPITAL_COMMUNITY): Payer: Self-pay | Admitting: *Deleted

## 2021-11-02 NOTE — Telephone Encounter (Signed)
Phone voicemail message left to return nurse call.  Duffy Rhody, RN 11-02-2021 at 9:40am

## 2021-12-22 IMAGING — US US THYROID
1 series · 14 of 25 positions shown · non-contrast
Comparison: None.

CLINICAL DATA: Dysphagia.

EXAM:
THYROID ULTRASOUND
TECHNIQUE: Ultrasound examination of the thyroid gland and adjacent soft
tissues was performed.

[Series 1: us thyroid · 0.05mm/px · 14 of 47 slices shown]
[im 1/47]
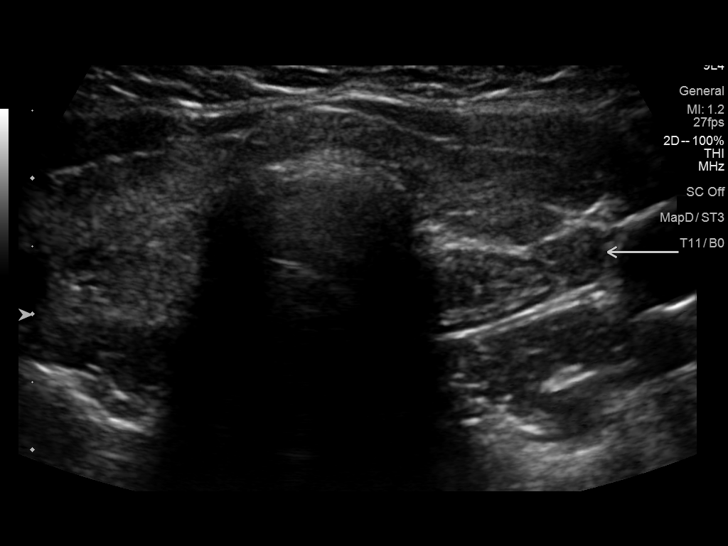
[im 4/47]
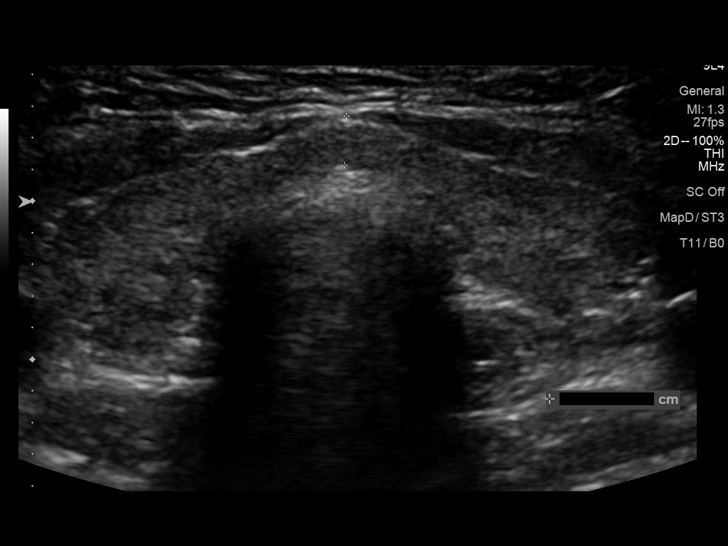
[im 8/47]
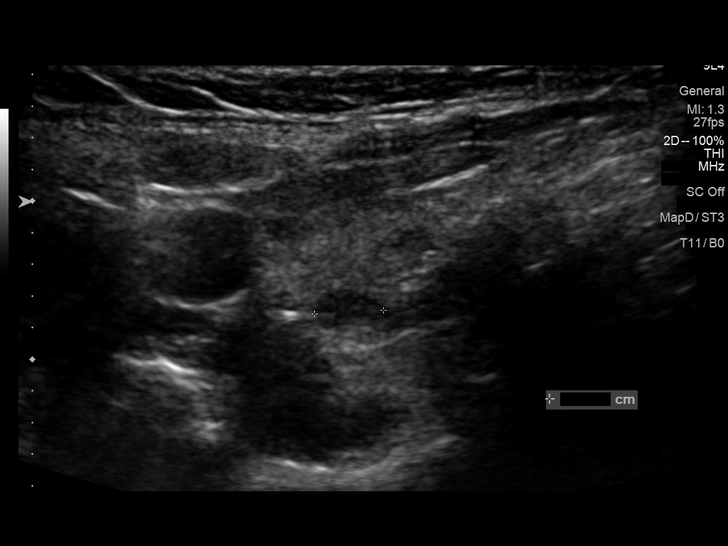
[im 12/47]
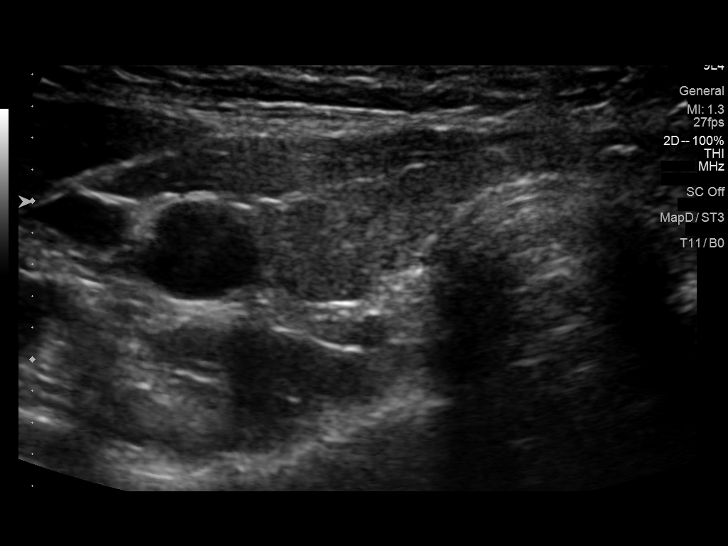
[im 16/47]
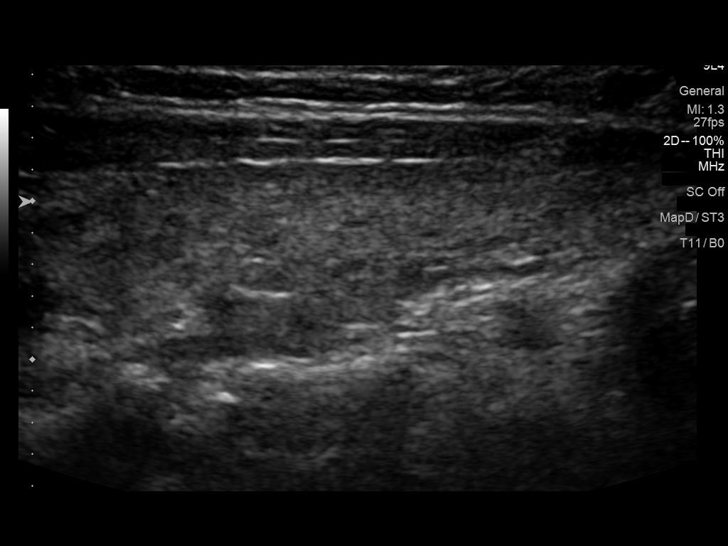
[im 18/47]
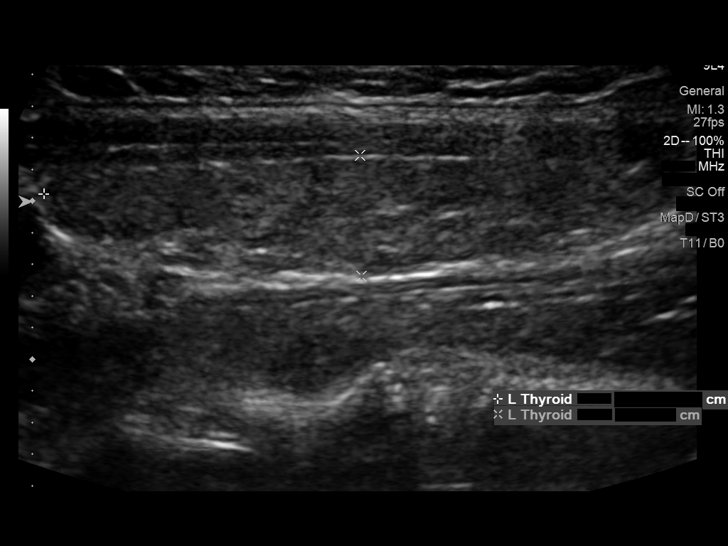
[im 22/47]
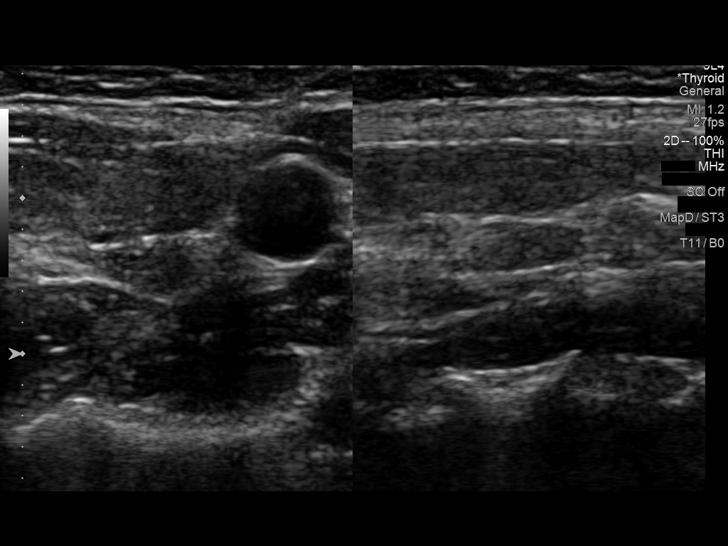
[im 25/47]
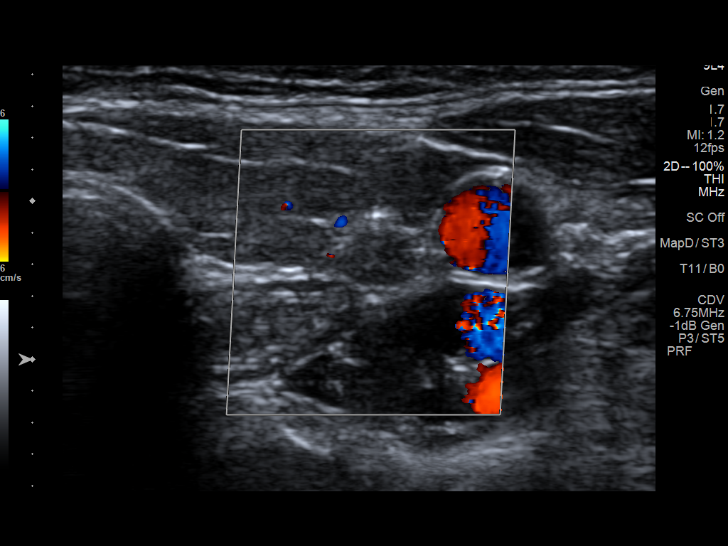
[im 29/47]
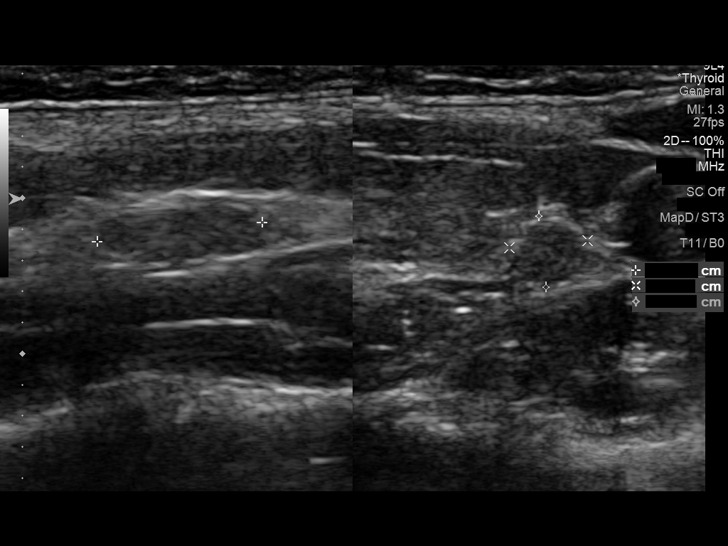
[im 31/47]
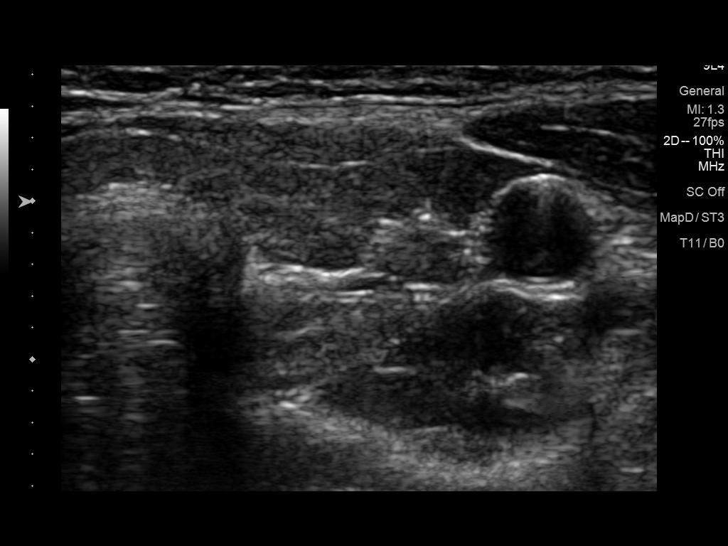
[im 35/47]
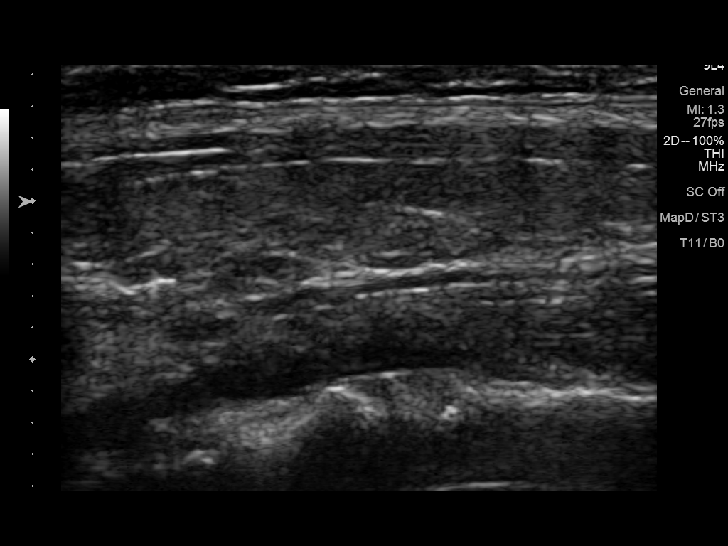
[im 39/47]
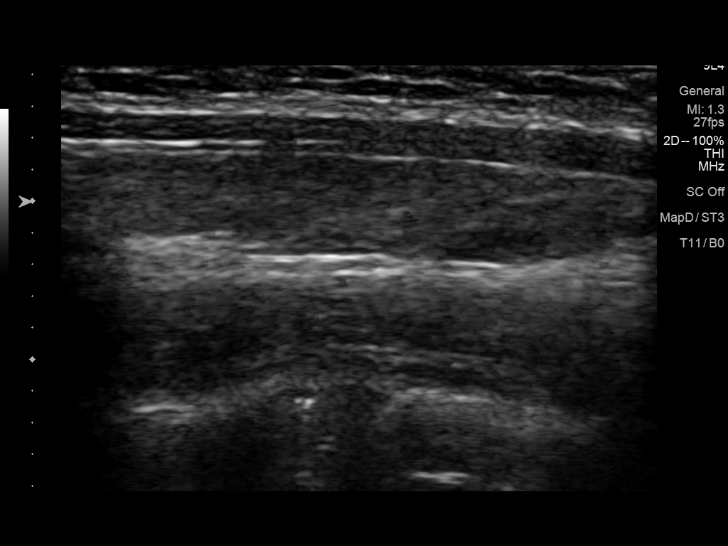
[im 43/47]
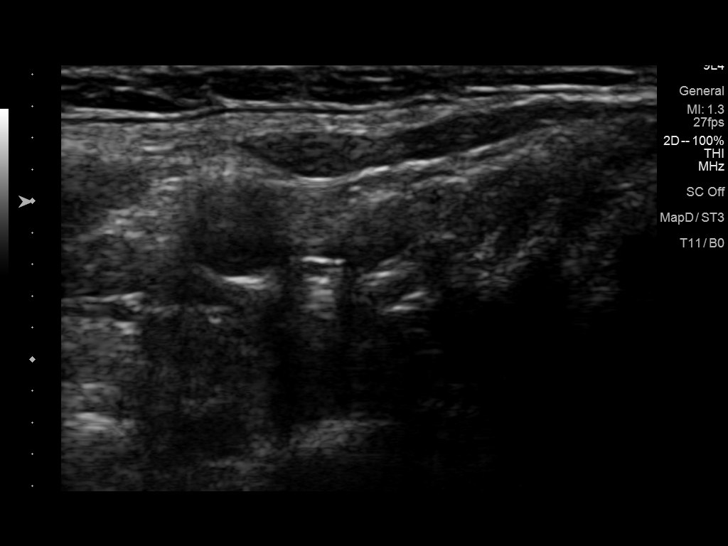
[im 47/47]
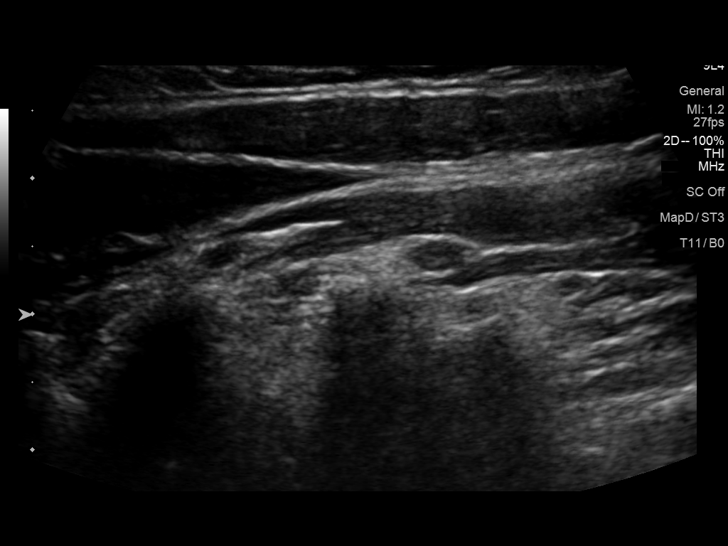

[14 of 25 positions shown; findings below may reference images not displayed]

FINDINGS: Parenchymal Echotexture: Moderately heterogenous

Isthmus: 0.3 cm

Right lobe: 5 x 1 x 1.3 cm

Left lobe: 4 x 0.8 x 1.5 cm

_________________________________________________________

Estimated total number of nodules >/= 1 cm: 1

Number of spongiform nodules >/=  2 cm not described below (TR1): 0

Number of mixed cystic and solid nodules >/= 1.5 cm not described
below (TR2): 0

_________________________________________________________

While the technologist labeled 2 potential nodules on the left, on
the provided cine clips, these nodules appear to represent pseudo
nodules secondary to the heterogeneous thyroid parenchyma. As such,
no distinct convincing thyroid nodules were identified on this
study.
IMPRESSION: Moderately heterogeneous thyroid gland without evidence for distinct
thyroid nodule.

The above is in keeping with the ACR TI-RADS recommendations - [HOSPITAL] 0947;[DATE].

## 2022-09-08 LAB — OB RESULTS CONSOLE HIV ANTIBODY (ROUTINE TESTING): HIV: NONREACTIVE

## 2022-09-08 LAB — OB RESULTS CONSOLE ANTIBODY SCREEN: Antibody Screen: NEGATIVE

## 2022-09-08 LAB — OB RESULTS CONSOLE ABO/RH: RH Type: POSITIVE

## 2022-09-08 LAB — HEPATITIS C ANTIBODY: HCV Ab: NEGATIVE

## 2022-09-08 LAB — OB RESULTS CONSOLE RPR: RPR: NONREACTIVE

## 2022-09-08 LAB — OB RESULTS CONSOLE RUBELLA ANTIBODY, IGM: Rubella: IMMUNE

## 2022-09-08 LAB — OB RESULTS CONSOLE HEPATITIS B SURFACE ANTIGEN: Hepatitis B Surface Ag: NEGATIVE

## 2022-09-22 LAB — OB RESULTS CONSOLE GC/CHLAMYDIA
Chlamydia: NEGATIVE
Neisseria Gonorrhea: NEGATIVE

## 2022-12-11 NOTE — L&D Delivery Note (Signed)
Delivery Note At 10:20 AM a viable female was delivered via  OA presentation apgars 9 and 9 weight  pending .   Placenta status: spontaneously with 3 vessel cord  ,  .  Cord:   with the following complications:  none.  Cord pH: not obtained  Anesthesia: Epidural Episiotomy:  none Lacerations:  none Suture Repair:  not applicable Est. Blood Loss (mL):  300  Mom to postpartum.  Baby to Couplet care / Skin to Skin.  Jeani Hawking 04/07/2023, 10:27 AM

## 2023-03-23 LAB — OB RESULTS CONSOLE GBS: GBS: POSITIVE

## 2023-04-06 ENCOUNTER — Other Ambulatory Visit: Payer: Self-pay

## 2023-04-06 ENCOUNTER — Encounter (HOSPITAL_COMMUNITY): Payer: Self-pay | Admitting: *Deleted

## 2023-04-06 ENCOUNTER — Encounter (HOSPITAL_COMMUNITY): Payer: Self-pay | Admitting: Obstetrics and Gynecology

## 2023-04-06 ENCOUNTER — Telehealth (HOSPITAL_COMMUNITY): Payer: Self-pay | Admitting: *Deleted

## 2023-04-06 ENCOUNTER — Inpatient Hospital Stay (HOSPITAL_COMMUNITY)
Admission: RE | Admit: 2023-04-06 | Discharge: 2023-04-08 | DRG: 807 | Disposition: A | Discharge status: Home / Self Care | Payer: Commercial Managed Care - PPO | Attending: Family Medicine | Admitting: Family Medicine

## 2023-04-06 DIAGNOSIS — O99824 Streptococcus B carrier state complicating childbirth: Principal | ICD-10-CM | POA: Diagnosis present

## 2023-04-06 DIAGNOSIS — Z3A39 39 weeks gestation of pregnancy: Secondary | ICD-10-CM | POA: Diagnosis not present

## 2023-04-06 DIAGNOSIS — E039 Hypothyroidism, unspecified: Secondary | ICD-10-CM | POA: Diagnosis present

## 2023-04-06 DIAGNOSIS — O26893 Other specified pregnancy related conditions, third trimester: Secondary | ICD-10-CM | POA: Diagnosis present

## 2023-04-06 DIAGNOSIS — Z3490 Encounter for supervision of normal pregnancy, unspecified, unspecified trimester: Principal | ICD-10-CM

## 2023-04-06 DIAGNOSIS — O99284 Endocrine, nutritional and metabolic diseases complicating childbirth: Secondary | ICD-10-CM | POA: Diagnosis present

## 2023-04-06 LAB — CBC
HCT: 33.8 % — ABNORMAL LOW (ref 36.0–46.0)
Hemoglobin: 10.7 g/dL — ABNORMAL LOW (ref 12.0–15.0)
MCH: 29.4 pg (ref 26.0–34.0)
MCHC: 31.7 g/dL (ref 30.0–36.0)
MCV: 92.9 fL (ref 80.0–100.0)
Platelets: 274 10*3/uL (ref 150–400)
RBC: 3.64 MIL/uL — ABNORMAL LOW (ref 3.87–5.11)
RDW: 13.7 % (ref 11.5–15.5)
WBC: 10.8 10*3/uL — ABNORMAL HIGH (ref 4.0–10.5)
nRBC: 0 % (ref 0.0–0.2)

## 2023-04-06 LAB — TYPE AND SCREEN
ABO/RH(D): A POS
Antibody Screen: NEGATIVE

## 2023-04-06 LAB — HIV ANTIBODY (ROUTINE TESTING W REFLEX): HIV Screen 4th Generation wRfx: NONREACTIVE

## 2023-04-06 MED ORDER — TERBUTALINE SULFATE 1 MG/ML IJ SOLN: 0.2500 mg | Freq: Once | INTRAMUSCULAR | Status: DC | PRN

## 2023-04-06 MED ORDER — OXYTOCIN-SODIUM CHLORIDE 30-0.9 UT/500ML-% IV SOLN: 2.5000 [IU]/h | INTRAVENOUS | Status: DC

## 2023-04-06 MED ORDER — OXYTOCIN-SODIUM CHLORIDE 30-0.9 UT/500ML-% IV SOLN
1.0000 m[IU]/min | INTRAVENOUS | Status: DC
  Administered 2023-04-07: 2 m[IU]/min via INTRAVENOUS
  Filled 2023-04-06: qty 500

## 2023-04-06 MED ORDER — SOD CITRATE-CITRIC ACID 500-334 MG/5ML PO SOLN
30.0000 mL | ORAL | Status: DC | PRN
  Administered 2023-04-06 – 2023-04-07 (×2): 30 mL via ORAL
  Filled 2023-04-06 (×2): qty 30

## 2023-04-06 MED ORDER — OXYCODONE-ACETAMINOPHEN 5-325 MG PO TABS: 2.0000 | ORAL_TABLET | ORAL | Status: DC | PRN

## 2023-04-06 MED ORDER — LACTATED RINGERS IV SOLN: 500.0000 mL | INTRAVENOUS | Status: DC | PRN

## 2023-04-06 MED ORDER — MISOPROSTOL 25 MCG QUARTER TABLET
25.0000 ug | ORAL_TABLET | Freq: Once | ORAL | Status: AC
  Administered 2023-04-06: 25 ug via VAGINAL
  Filled 2023-04-06: qty 1

## 2023-04-06 MED ORDER — ONDANSETRON HCL 4 MG/2ML IJ SOLN: 4.0000 mg | Freq: Four times a day (QID) | INTRAMUSCULAR | Status: DC | PRN

## 2023-04-06 MED ORDER — ACETAMINOPHEN 325 MG PO TABS: 650.0000 mg | ORAL_TABLET | ORAL | Status: DC | PRN

## 2023-04-06 MED ORDER — OXYTOCIN BOLUS FROM INFUSION
333.0000 mL | Freq: Once | INTRAVENOUS | Status: AC
  Administered 2023-04-07: 333 mL via INTRAVENOUS

## 2023-04-06 MED ORDER — OXYCODONE-ACETAMINOPHEN 5-325 MG PO TABS: 1.0000 | ORAL_TABLET | ORAL | Status: DC | PRN

## 2023-04-06 MED ORDER — LACTATED RINGERS IV SOLN: INTRAVENOUS | Status: DC

## 2023-04-06 MED ORDER — SODIUM CHLORIDE 0.9 % IV SOLN
2.0000 g | Freq: Once | INTRAVENOUS | Status: AC
  Administered 2023-04-06: 2 g via INTRAVENOUS
  Filled 2023-04-06: qty 2000

## 2023-04-06 MED ORDER — SODIUM CHLORIDE 0.9 % IV SOLN
1.0000 g | INTRAVENOUS | Status: DC
  Administered 2023-04-06 – 2023-04-07 (×3): 1 g via INTRAVENOUS
  Filled 2023-04-06 (×3): qty 1000

## 2023-04-06 MED ORDER — LIDOCAINE HCL (PF) 1 % IJ SOLN: 30.0000 mL | INTRAMUSCULAR | Status: DC | PRN

## 2023-04-06 MED ORDER — MISOPROSTOL 25 MCG QUARTER TABLET
25.0000 ug | ORAL_TABLET | Freq: Once | ORAL | Status: AC
  Administered 2023-04-06: 25 ug via ORAL
  Filled 2023-04-06: qty 1

## 2023-04-06 NOTE — Telephone Encounter (Signed)
Preadmission screen  

## 2023-04-06 NOTE — Progress Notes (Signed)
Assume care of patient

## 2023-04-07 ENCOUNTER — Inpatient Hospital Stay (HOSPITAL_COMMUNITY): Payer: Commercial Managed Care - PPO | Admitting: Anesthesiology

## 2023-04-07 ENCOUNTER — Encounter (HOSPITAL_COMMUNITY): Payer: Self-pay | Admitting: Obstetrics and Gynecology

## 2023-04-07 LAB — RPR: RPR Ser Ql: NONREACTIVE

## 2023-04-07 MED ORDER — LIDOCAINE HCL (PF) 1 % IJ SOLN
INTRAMUSCULAR | Status: DC | PRN
  Administered 2023-04-07: 11 mL via EPIDURAL

## 2023-04-07 MED ORDER — ACETAMINOPHEN 325 MG PO TABS
650.0000 mg | ORAL_TABLET | ORAL | Status: DC | PRN
  Administered 2023-04-07 (×2): 650 mg via ORAL
  Filled 2023-04-07 (×2): qty 2

## 2023-04-07 MED ORDER — EPHEDRINE 5 MG/ML INJ: 10.0000 mg | INTRAVENOUS | Status: DC | PRN

## 2023-04-07 MED ORDER — COCONUT OIL OIL: 1.0000 | TOPICAL_OIL | Status: DC | PRN

## 2023-04-07 MED ORDER — TETANUS-DIPHTH-ACELL PERTUSSIS 5-2.5-18.5 LF-MCG/0.5 IM SUSY: 0.5000 mL | PREFILLED_SYRINGE | Freq: Once | INTRAMUSCULAR | Status: DC

## 2023-04-07 MED ORDER — LACTATED RINGERS IV SOLN
500.0000 mL | Freq: Once | INTRAVENOUS | Status: AC
  Administered 2023-04-07: 500 mL via INTRAVENOUS

## 2023-04-07 MED ORDER — IBUPROFEN 600 MG PO TABS
600.0000 mg | ORAL_TABLET | Freq: Four times a day (QID) | ORAL | Status: DC
  Administered 2023-04-07 – 2023-04-08 (×4): 600 mg via ORAL
  Filled 2023-04-07 (×4): qty 1

## 2023-04-07 MED ORDER — DIPHENHYDRAMINE HCL 50 MG/ML IJ SOLN: 12.5000 mg | INTRAMUSCULAR | Status: DC | PRN

## 2023-04-07 MED ORDER — LEVOTHYROXINE SODIUM 100 MCG PO TABS
100.0000 ug | ORAL_TABLET | Freq: Every day | ORAL | Status: DC
  Administered 2023-04-08: 100 ug via ORAL
  Filled 2023-04-07: qty 1

## 2023-04-07 MED ORDER — ZOLPIDEM TARTRATE 5 MG PO TABS: 5.0000 mg | ORAL_TABLET | Freq: Every evening | ORAL | Status: DC | PRN

## 2023-04-07 MED ORDER — DIPHENHYDRAMINE HCL 25 MG PO CAPS: 25.0000 mg | ORAL_CAPSULE | Freq: Four times a day (QID) | ORAL | Status: DC | PRN

## 2023-04-07 MED ORDER — ONDANSETRON HCL 4 MG/2ML IJ SOLN: 4.0000 mg | INTRAMUSCULAR | Status: DC | PRN

## 2023-04-07 MED ORDER — MEASLES, MUMPS & RUBELLA VAC IJ SOLR: 0.5000 mL | Freq: Once | INTRAMUSCULAR | Status: DC

## 2023-04-07 MED ORDER — BISACODYL 10 MG RE SUPP: 10.0000 mg | Freq: Every day | RECTAL | Status: DC | PRN

## 2023-04-07 MED ORDER — PRENATAL MULTIVITAMIN CH
1.0000 | ORAL_TABLET | Freq: Every day | ORAL | Status: DC
  Administered 2023-04-08: 1 via ORAL
  Filled 2023-04-07: qty 1

## 2023-04-07 MED ORDER — DIBUCAINE (PERIANAL) 1 % EX OINT: 1.0000 | TOPICAL_OINTMENT | CUTANEOUS | Status: DC | PRN

## 2023-04-07 MED ORDER — PANTOPRAZOLE SODIUM 40 MG IV SOLR
40.0000 mg | Freq: Once | INTRAVENOUS | Status: AC
  Administered 2023-04-07: 40 mg via INTRAVENOUS
  Filled 2023-04-07: qty 10

## 2023-04-07 MED ORDER — WITCH HAZEL-GLYCERIN EX PADS
1.0000 | MEDICATED_PAD | CUTANEOUS | Status: DC | PRN
  Administered 2023-04-07: 1 via TOPICAL

## 2023-04-07 MED ORDER — OXYCODONE HCL 5 MG PO TABS: 5.0000 mg | ORAL_TABLET | ORAL | Status: DC | PRN

## 2023-04-07 MED ORDER — SENNOSIDES-DOCUSATE SODIUM 8.6-50 MG PO TABS
2.0000 | ORAL_TABLET | Freq: Every day | ORAL | Status: DC
  Administered 2023-04-08: 2 via ORAL
  Filled 2023-04-07: qty 2

## 2023-04-07 MED ORDER — BENZOCAINE-MENTHOL 20-0.5 % EX AERO
1.0000 | INHALATION_SPRAY | CUTANEOUS | Status: DC | PRN
  Filled 2023-04-07: qty 56

## 2023-04-07 MED ORDER — FENTANYL-BUPIVACAINE-NACL 0.5-0.125-0.9 MG/250ML-% EP SOLN
12.0000 mL/h | EPIDURAL | Status: DC | PRN
  Administered 2023-04-07: 12 mL/h via EPIDURAL
  Filled 2023-04-07: qty 250

## 2023-04-07 MED ORDER — OXYCODONE HCL 5 MG PO TABS: 10.0000 mg | ORAL_TABLET | ORAL | Status: DC | PRN

## 2023-04-07 MED ORDER — PHENYLEPHRINE 80 MCG/ML (10ML) SYRINGE FOR IV PUSH (FOR BLOOD PRESSURE SUPPORT): 80.0000 ug | PREFILLED_SYRINGE | INTRAVENOUS | Status: DC | PRN

## 2023-04-07 MED ORDER — FENTANYL CITRATE (PF) 100 MCG/2ML IJ SOLN
100.0000 ug | INTRAMUSCULAR | Status: DC | PRN
  Administered 2023-04-07 (×2): 100 ug via INTRAVENOUS
  Filled 2023-04-07 (×2): qty 2

## 2023-04-07 MED ORDER — ONDANSETRON HCL 4 MG PO TABS: 4.0000 mg | ORAL_TABLET | ORAL | Status: DC | PRN

## 2023-04-07 MED ORDER — SIMETHICONE 80 MG PO CHEW: 80.0000 mg | CHEWABLE_TABLET | ORAL | Status: DC | PRN

## 2023-04-07 MED ORDER — MEDROXYPROGESTERONE ACETATE 150 MG/ML IM SUSP: 150.0000 mg | INTRAMUSCULAR | Status: DC | PRN

## 2023-04-07 MED ORDER — FLEET ENEMA 7-19 GM/118ML RE ENEM: 1.0000 | ENEMA | Freq: Every day | RECTAL | Status: DC | PRN

## 2023-04-07 NOTE — Progress Notes (Signed)
Dr Vincente Poli notified of current SVE 4/70/-1 and patient currently has an epidural. Orders receive to recheck  dilation in 1 - 1 1/2 hours and update physician with SVE.

## 2023-04-07 NOTE — Anesthesia Preprocedure Evaluation (Signed)
Anesthesia Evaluation  Patient identified by MRN, date of birth, ID band Patient awake    Reviewed: Allergy & Precautions, H&P , NPO status , Patient's Chart, lab work & pertinent test results  History of Anesthesia Complications Negative for: history of anesthetic complications  Airway Mallampati: II  TM Distance: >3 FB     Dental   Pulmonary neg pulmonary ROS   Pulmonary exam normal        Cardiovascular negative cardio ROS  Rhythm:regular Rate:Normal     Neuro/Psych negative neurological ROS  negative psych ROS   GI/Hepatic negative GI ROS, Neg liver ROS,,,  Endo/Other  Hypothyroidism    Renal/GU negative Renal ROS  negative genitourinary   Musculoskeletal scoliosis   Abdominal   Peds  Hematology negative hematology ROS (+)   Anesthesia Other Findings   Reproductive/Obstetrics (+) Pregnancy                              Anesthesia Physical Anesthesia Plan  ASA: 2  Anesthesia Plan: Epidural   Post-op Pain Management:    Induction:   PONV Risk Score and Plan:   Airway Management Planned:   Additional Equipment:   Intra-op Plan:   Post-operative Plan:   Informed Consent: I have reviewed the patients History and Physical, chart, labs and discussed the procedure including the risks, benefits and alternatives for the proposed anesthesia with the patient or authorized representative who has indicated his/her understanding and acceptance.       Plan Discussed with:   Anesthesia Plan Comments:          Anesthesia Quick Evaluation

## 2023-04-07 NOTE — H&P (Signed)
Cristina Jackson is a 27 y.o. G 2 P 1001 at 39 w 1 day presents for elective IOL. Status post cytotec, now on Pitocin and has epidural. OB History     Gravida  2   Para  1   Term  1   Preterm      AB      Living  1      SAB      IAB      Ectopic      Multiple  0   Live Births  1          Past Medical History:  Diagnosis Date   Headache(784.0)    Hypothyroidism    Past Surgical History:  Procedure Laterality Date   ARTHROSCOPIC REPAIR ACL  04/10/2010   Dr. Thomasena Edis   NOSE SURGERY     WISDOM TOOTH EXTRACTION  2018   Family History: family history includes Cancer in her paternal grandmother; Emphysema in her maternal grandfather; Hypertension in her maternal grandmother and mother; Lymphoma in her paternal grandfather. Social History:  reports that she has never smoked. She has never used smokeless tobacco. She reports that she does not drink alcohol and does not use drugs.     Maternal Diabetes: No Genetic Screening: Normal Maternal Ultrasounds/Referrals: Normal Fetal Ultrasounds or other Referrals:  None Maternal Substance Abuse:  No Significant Maternal Medications:  None Significant Maternal Lab Results:  None Number of Prenatal Visits:greater than 3 verified prenatal visits Other Comments:  None  Review of Systems  All other systems reviewed and are negative.  Maternal Medical History:  Fetal activity: Perceived fetal activity is normal.     Dilation: 5 Effacement (%): 70 Station: -2 Exam by:: Aura Dials RN Blood pressure (!) 95/54, pulse 71, temperature 99.1 F (37.3 C), temperature source Oral, resp. rate 16, height 5\' 4"  (1.626 m), weight 78.9 kg, SpO2 99 %, unknown if currently breastfeeding. Maternal Exam:  Abdomen: Fetal presentation: vertex   Fetal Exam Fetal State Assessment: Category I - tracings are normal.   Physical Exam Vitals and nursing note reviewed. Exam conducted with a chaperone present.  HENT:     Head:  Normocephalic.  Cardiovascular:     Rate and Rhythm: Normal rate and regular rhythm.     Pulses: Normal pulses.  Pulmonary:     Effort: Pulmonary effort is normal.     Prenatal labs: ABO, Rh: --/--/A POS (04/26 1852) Antibody: NEG (04/26 1852) Rubella: Immune (09/29 0000) RPR: Nonreactive (09/29 0000)  HBsAg: Negative (09/29 0000)  HIV: Non Reactive (04/26 1849)  GBS: Positive/-- (04/12 0000)   Assessment/Plan: IUP at term GBBS + IOL Continue Pitocin Follow labor curve Antibiotics for GBBS +   Jeani Hawking 04/07/2023, 7:47 AM

## 2023-04-07 NOTE — Progress Notes (Signed)
CSW acknowledges consult and completed clinical assessment. Clinical documentation will follow.  There are no barriers to d/c.  Signed,  Brigett Estell K. Jalayla Chrismer, MSW, LCSWA, LCASA 04/07/2023 6:45 PM 

## 2023-04-07 NOTE — Anesthesia Procedure Notes (Signed)
Epidural Patient location during procedure: OB Start time: 04/07/2023 3:54 AM End time: 04/07/2023 4:07 AM  Staffing Anesthesiologist: Lowella Curb, MD Performed: anesthesiologist   Preanesthetic Checklist Completed: patient identified, IV checked, site marked, risks and benefits discussed, surgical consent, monitors and equipment checked, pre-op evaluation and timeout performed  Epidural Patient position: sitting Prep: ChloraPrep Patient monitoring: heart rate, cardiac monitor, continuous pulse ox and blood pressure Approach: midline Location: L2-L3 Injection technique: LOR saline  Needle:  Needle type: Tuohy  Needle gauge: 17 G Needle length: 9 cm Needle insertion depth: 4 cm Catheter type: closed end flexible Catheter size: 20 Guage Catheter at skin depth: 8 cm Test dose: negative  Assessment Events: blood not aspirated, injection not painful, no injection resistance, no paresthesia and negative IV test  Additional Notes Reason for block:procedure for pain

## 2023-04-07 NOTE — Lactation Note (Signed)
This note was copied from a baby's chart. Lactation Consultation Note  Patient Name: Cristina Jackson [id number] Date: 04/07/2023 Age:27 hours Reason for consult: Initial assessment  P2, Baby sleeping.  Visitors in room.  Mother is experienced with breastfeeding.  Feed on demand with cues.  Goal 8-12+ times per day after first 24 hrs. Mother will call for help as needed. Provided lactation information sheet.     Maternal Data Does the patient have breastfeeding experience prior to this delivery?: Yes How long did the patient breastfeed?: 13 mos.  Feeding Mother's Current Feeding Choice: Breast Milk Interventions Interventions: Arts administrator    Consult Status Consult Status: Follow-up Date: 04/08/23 Follow-up type: In-patient    [name] [facility] 04/07/2023, 2:39 PM

## 2023-04-08 LAB — CBC
HCT: 27.7 % — ABNORMAL LOW (ref 36.0–46.0)
Hemoglobin: 9.1 g/dL — ABNORMAL LOW (ref 12.0–15.0)
MCH: 29.9 pg (ref 26.0–34.0)
MCHC: 32.9 g/dL (ref 30.0–36.0)
MCV: 91.1 fL (ref 80.0–100.0)
Platelets: 197 10*3/uL (ref 150–400)
RBC: 3.04 MIL/uL — ABNORMAL LOW (ref 3.87–5.11)
RDW: 14 % (ref 11.5–15.5)
WBC: 13.8 10*3/uL — ABNORMAL HIGH (ref 4.0–10.5)
nRBC: 0 % (ref 0.0–0.2)

## 2023-04-08 NOTE — Lactation Note (Signed)
This note was copied from a baby's chart. Lactation Consultation Note  Patient Name: Cristina Jackson [id number] Date: 04/08/2023 Age:27 hours Reason for consult: Follow-up assessment  P2, Mother states breastfeeding is going well. Reviewed engorgement care and monitoring voids/stools.   Maternal Data Has patient been taught Hand Expression?: Yes Does the patient have breastfeeding experience prior to this delivery?: Yes  Feeding Mother's Current Feeding Choice: Breast Milk Interventions Interventions: Education  Discharge Discharge Education: Engorgement and breast care;Warning signs for feeding baby  Consult Status Consult Status: Complete Date: 04/08/23    [name] [facility] 04/08/2023, 12:15 PM

## 2023-04-08 NOTE — Anesthesia Postprocedure Evaluation (Signed)
Anesthesia Post Note  Patient: Cristina Jackson  Procedure(s) Performed: AN AD HOC LABOR EPIDURAL     Patient location during evaluation: Mother Baby Anesthesia Type: Epidural Level of consciousness: awake and alert and oriented Pain management: satisfactory to patient Vital Signs Assessment: post-procedure vital signs reviewed and stable Respiratory status: respiratory function stable Cardiovascular status: stable Postop Assessment: no headache, no backache, epidural receding, patient able to bend at knees, no signs of nausea or vomiting, adequate PO intake and able to ambulate Anesthetic complications: no   No notable events documented.  Last Vitals:  Vitals:   04/08/23 0116 04/08/23 0450  BP: 111/73 116/81  Pulse: 65 80  Resp: 17 18  Temp: 36.8 C 36.6 C  SpO2: 98% 100%    Last Pain:  Vitals:   04/08/23 0450  TempSrc: Oral  PainSc: 4    Pain Goal: Patients Stated Pain Goal: 0 (04/07/23 0345)                 [name]

## 2023-04-08 NOTE — Progress Notes (Signed)
PPD # 1  Doing well BP 116/81 (BP Location: Right Arm)   Pulse 80   Temp 97.8 F (36.6 C) (Oral)   Resp 18   Ht 5\' 4"  (1.626 m)   Wt 78.9 kg   SpO2 100%   Breastfeeding Unknown   BMI 29.87 kg/m  Results for orders placed or performed during the hospital encounter of 04/06/23 (from the past 24 hour(s))  CBC     Status: Abnormal   Collection Time: 04/08/23  4:21 AM  Result Value Ref Range   WBC 13.8 (H) 4.0 - 10.5 K/uL   RBC 3.04 (L) 3.87 - 5.11 MIL/uL   Hemoglobin 9.1 (L) 12.0 - 15.0 g/dL   HCT 18.8 (L) 41.6 - 60.6 %   MCV 91.1 80.0 - 100.0 fL   MCH 29.9 26.0 - 34.0 pg   MCHC 32.9 30.0 - 36.0 g/dL   RDW 30.1 60.1 - 09.3 %   Platelets 197 150 - 400 K/uL   nRBC 0.0 0.0 - 0.2 %   Uterus firm  Lochia WNL  PPD # 1  Doing well Routine care  Discharge home tomorrow

## 2023-04-08 NOTE — Progress Notes (Signed)
CSW received consult for hx of Anxiety.  CSW met with MOB to offer support and complete assessment. When CSW entered room, MOB was observed laying in hospital bed, infant was asleep on her back in bassinet nearby. CSW introduced self and explained reason for consult. MOB presented as calm, was agreeable to consult and remained engaged throughout encounter.   CSW inquired how MOB is feeling emotionally since the birth of infant. MOB shares the is feeling good; however, she explained that this is the first time she has been separated from her 7 month old daughter for more than a couple of hours which has been emotionally difficult. CSW provided emotional support. CSW inquired about MOB's mental health history. MOB confirms a diagnosis of Generalized Anxiety Disorder. MOB was unable to recall the date of her diagnosis; however, she reports that her symptoms of GAD were most prevalent around 2021 during the COVID-19 pandemic. MOB shares her symptoms of anxiety have been manageable since this time. CSW inquired about a history of postpartum depression/anxiety. MOB reports that after the birth of her now 21 month old daughter, she endorsed symptoms of the baby blues, marked by feelings of guilt for "wishing away" her pregnancy. MOB recalls that her feelings of guilt resolved after about 2-3 weeks postpartum. MOB shares that she has been able to enjoy her pregnancy with infant more due to not feeling as nauseas during pregnancy. MOB shares she resides near [place], [place] and is currently staying with her parents until she and FOB feel ready to transition home, which they plan on returning to [place] in the coming days. MOB shares her mother will be staying with her in [place] once they return home as additional support. MOB added that FOB works from home so will be able to support MOB while she recovers and adjusts postpartum. MOB states she has not taken psychotropic medication or attended therapy in the past.  MOB was agreeable to outpatient mental health resources, which CSW provided. MOB denied current SI/HI/DV.  CSW provided education regarding the baby blues period vs. perinatal mood disorders, discussed treatment and gave resources for mental health follow up if concerns arise.  CSW recommends self-evaluation during the postpartum time period using the New Mom Checklist from Postpartum Progress and encouraged MOB to contact a medical professional if symptoms are noted at any time.    MOB reports she has all needed items for infant, including a car seat and bassinet. MOB has chosen [facility] in [place], [place] for infant's follow up care. MOB shares that she is connected with [facility] in [place] if she needs to initiate outpatient peds care prior to returning to [place].  CSW provided review of Sudden Infant Death Syndrome (SIDS) precautions.    CSW identifies no further need for intervention and no barriers to discharge at this time.  Signed,  Norberto Sorenson, MSW, LCSWA, LCASA 04/08/2023 9:52 AM

## 2023-04-08 NOTE — Discharge Summary (Signed)
Postpartum Discharge Summary  Date of ServiceApril 28, 2024     Patient Name: Cristina Jackson DOB: [birth year] MRN: [id number]  Date of admission: 04/06/2023 Delivery date:04/07/2023  Delivering provider: Marcelle Overlie  Date of discharge: 04/08/2023  Admitting diagnosis: Pregnancy [Z34.90] NSVD (normal spontaneous vaginal delivery) [O80] Intrauterine pregnancy: [redacted]w[redacted]d     Secondary diagnosis:  Principal Problem:   Pregnancy Active Problems:   NSVD (normal spontaneous vaginal delivery)  Additional problems: none    Discharge diagnosis: Term Pregnancy Delivered                                              Post partum procedures: not applicable Augmentation: AROM, Pitocin, and Cytotec Complications: None  Hospital course: Induction of Labor With Vaginal Delivery   27 y.o. yo G2P2002 at [redacted]w[redacted]d was admitted to the hospital 04/06/2023 for induction of labor.  Indication for induction: Elective.  Patient had an labor course complicated bynothing Membrane Rupture Time/Date: 7:44 AM ,04/07/2023   Delivery Method:Vaginal, Spontaneous  Episiotomy: None  Lacerations:  None  Details of delivery can be found in separate delivery note.  Patient had a postpartum course complicated bynothing. Patient is discharged home 04/08/23.  Newborn Data: Birth date:04/07/2023  Birth time:10:20 AM  Gender:Female  Living status:Living  Apgars:8 ,9  Weight:3960 g   Magnesium Sulfate received: No BMZ received: No Rhophylac:N/A MMR:N/A T-DaP:Given prenatally Flu: N/A Transfusion:No  Physical exam  Vitals:   04/07/23 1455 04/07/23 1655 04/08/23 0116 04/08/23 0450  BP: 117/73 98/62 111/73 116/81  Pulse: 73 77 65 80  Resp:  16 17 18   Temp:  97.8 F (36.6 C) 98.2 F (36.8 C) 97.8 F (36.6 C)  TempSrc:  Oral Oral Oral  SpO2: 99% 98% 98% 100%  Weight:      Height:       General: alert, cooperative, and no distress Lochia: appropriate Uterine Fundus: firm Incision: Healing well with no  significant drainage DVT Evaluation: No evidence of DVT seen on physical exam. Labs: Lab Results  Component Value Date   WBC 13.8 (H) 04/08/2023   HGB 9.1 (L) 04/08/2023   HCT 27.7 (L) 04/08/2023   MCV 91.1 04/08/2023   PLT 197 04/08/2023       No data to display         [place] Score:    10/23/2021   10:15 AM  Edinburgh Postnatal Depression Scale Screening Tool  I have been able to laugh and see the funny side of things. 0  I have looked forward with enjoyment to things. 0  I have blamed myself unnecessarily when things went wrong. 2  I have been anxious or worried for no good reason. 2  I have felt scared or panicky for no good reason. 1  Things have been getting on top of me. 0  I have been so unhappy that I have had difficulty sleeping. 0  I have felt sad or miserable. 0  I have been so unhappy that I have been crying. 1  The thought of harming myself has occurred to me. 0  Edinburgh Postnatal Depression Scale Total 6      After visit meds:  Allergies as of 04/08/2023   No Known Allergies      Medication List     TAKE these medications    acetaminophen 500 MG tablet Commonly known as:  TYLENOL Take 500 mg by mouth every 6 (six) hours as needed for headache (Take 2 by mouth every 6 hours as needed for pain.).   ibuprofen 200 MG tablet Commonly known as: ADVIL Take 200 mg by mouth every 6 (six) hours as needed for headache (Take 2 by mouth every 6 hours as needed for pain.).   levothyroxine 100 MCG tablet Commonly known as: SYNTHROID Take 100 mcg by mouth daily before breakfast.   prenatal multivitamin Tabs tablet Take 1 tablet by mouth daily at 12 noon.         Discharge home in stable condition Infant Feeding: Breast Infant Disposition:home with mother Discharge instruction: per After Visit Summary and Postpartum booklet. Activity: Advance as tolerated. Pelvic rest for 6 weeks.  Diet: routine diet Anticipated Birth Control:  Unsure Postpartum Appointment:6 weeks Additional Postpartum F/U:  not applicable Future Appointments:No future appointments. Follow up Visit:      04/08/2023 Jeani Hawking, MD

## 2023-04-18 ENCOUNTER — Telehealth (HOSPITAL_COMMUNITY): Payer: Self-pay | Admitting: *Deleted

## 2023-04-18 NOTE — Telephone Encounter (Signed)
Attempted Hospital Discharge Follow-Up Call.  Left voice mail requesting that patient return RN's phone call if patient has any concerns or questions.  

## 2023-04-20 ENCOUNTER — Inpatient Hospital Stay (HOSPITAL_COMMUNITY): Payer: Commercial Managed Care - PPO

## 2023-12-12 NOTE — L&D Delivery Note (Signed)
 Delivery Note At 2:36 PM a viable female was delivered via  (Presentation: OA      ).  APGAR: pending ; weight pending.   Placenta status: Spontaneous, Intact.  Cord:   with the following complications:  .  Cord pH: not sent  Anesthesia: Epidural Episiotomy: None   Lacerations:  None Suture Repair: None Est. Blood Loss (mL):  See RN notes.   It's a girl Lo lo!!   Mom to postpartum.  Baby to Couplet care / Skin to Skin.  Kelly Nest Jesiel Garate 10/31/2024, 3:01 PM

## 2024-04-09 LAB — OB RESULTS CONSOLE ANTIBODY SCREEN: Antibody Screen: NEGATIVE

## 2024-04-09 LAB — OB RESULTS CONSOLE ABO/RH: RH Type: POSITIVE

## 2024-04-09 LAB — OB RESULTS CONSOLE HIV ANTIBODY (ROUTINE TESTING): HIV: NONREACTIVE

## 2024-04-09 LAB — HEPATITIS C ANTIBODY: HCV Ab: NEGATIVE

## 2024-04-09 LAB — OB RESULTS CONSOLE RUBELLA ANTIBODY, IGM: Rubella: IMMUNE

## 2024-04-09 LAB — OB RESULTS CONSOLE RPR: RPR: NONREACTIVE

## 2024-04-09 LAB — OB RESULTS CONSOLE HEPATITIS B SURFACE ANTIGEN: Hepatitis B Surface Ag: NEGATIVE

## 2024-04-09 LAB — OB RESULTS CONSOLE GC/CHLAMYDIA
Chlamydia: NEGATIVE
Neisseria Gonorrhea: NEGATIVE

## 2024-10-13 LAB — OB RESULTS CONSOLE GBS: GBS: NEGATIVE

## 2024-10-30 ENCOUNTER — Telehealth (HOSPITAL_COMMUNITY): Payer: Self-pay | Admitting: *Deleted

## 2024-10-30 ENCOUNTER — Encounter (HOSPITAL_COMMUNITY): Payer: Self-pay | Admitting: *Deleted

## 2024-10-30 NOTE — Telephone Encounter (Signed)
 Preadmission screen

## 2024-10-30 NOTE — H&P (Signed)
 Cristina Jackson is a 28 y.o. female presenting for eIOL. Hx LGA babies. Recent US  with EFW 84%ile (7#14). She has a history of hypthyroidism not on meds. Largest baby weighed 8#11. Expecting a RR girl.  OB History     Gravida  2   Para  2   Term  2   Preterm      AB      Living  2      SAB      IAB      Ectopic      Multiple  0   Live Births  2          Past Medical History:  Diagnosis Date   Headache(784.0)    Hypothyroidism    Past Surgical History:  Procedure Laterality Date   ARTHROSCOPIC REPAIR ACL  04/10/2010   Dr. Gerome   NOSE SURGERY     WISDOM TOOTH EXTRACTION  2018   Family History: family history includes Cancer in her paternal grandmother; Emphysema in her maternal grandfather; Hypertension in her maternal grandmother and mother; Lymphoma in her paternal grandfather. Social History:  reports that she has never smoked. She has never used smokeless tobacco. She reports that she does not drink alcohol and does not use drugs.     Maternal Diabetes: No Genetic Screening: Normal Maternal Ultrasounds/Referrals: Normal Fetal Ultrasounds or other Referrals:  None Maternal Substance Abuse:  No Significant Maternal Medications:  None Significant Maternal Lab Results:  Group B Strep negative Number of Prenatal Visits:greater than 3 verified prenatal visits Review of Systems History   unknown if currently breastfeeding. Exam Physical Exam  (from office) NAD, A&O NWOB Abd soft, nondistended, gravid  Prenatal labs: ABO, Rh:   Antibody:   Rubella:   RPR:    HBsAg:    HIV:    GBS:     Assessment/Plan: 28 yo G3P2002 @ 48 wga presenting for IOL s/s elective reasons. Cervix unfavorable. Plan for cytotec  followed by pitocin /AROM when more favorable.  GBS negative.     Kelly Nest Ezekial Arns 10/30/2024, 12:06 PM

## 2024-10-31 ENCOUNTER — Inpatient Hospital Stay (HOSPITAL_COMMUNITY): Admitting: Anesthesiology

## 2024-10-31 ENCOUNTER — Inpatient Hospital Stay (HOSPITAL_COMMUNITY)
Admission: RE | Admit: 2024-10-31 | Discharge: 2024-11-01 | DRG: 807 | Disposition: A | Discharge status: Home / Self Care | Attending: Obstetrics and Gynecology | Admitting: Obstetrics and Gynecology

## 2024-10-31 ENCOUNTER — Other Ambulatory Visit: Payer: Self-pay

## 2024-10-31 ENCOUNTER — Encounter (HOSPITAL_COMMUNITY): Payer: Self-pay | Admitting: Obstetrics and Gynecology

## 2024-10-31 DIAGNOSIS — K219 Gastro-esophageal reflux disease without esophagitis: Secondary | ICD-10-CM | POA: Diagnosis present

## 2024-10-31 DIAGNOSIS — O26893 Other specified pregnancy related conditions, third trimester: Secondary | ICD-10-CM | POA: Diagnosis present

## 2024-10-31 DIAGNOSIS — O9962 Diseases of the digestive system complicating childbirth: Secondary | ICD-10-CM | POA: Diagnosis present

## 2024-10-31 DIAGNOSIS — Z3490 Encounter for supervision of normal pregnancy, unspecified, unspecified trimester: Principal | ICD-10-CM

## 2024-10-31 DIAGNOSIS — Z3A39 39 weeks gestation of pregnancy: Secondary | ICD-10-CM | POA: Diagnosis not present

## 2024-10-31 DIAGNOSIS — O99214 Obesity complicating childbirth: Secondary | ICD-10-CM | POA: Diagnosis present

## 2024-10-31 DIAGNOSIS — O9902 Anemia complicating childbirth: Secondary | ICD-10-CM | POA: Diagnosis present

## 2024-10-31 DIAGNOSIS — Z8249 Family history of ischemic heart disease and other diseases of the circulatory system: Secondary | ICD-10-CM

## 2024-10-31 LAB — TYPE AND SCREEN
ABO/RH(D): A POS
Antibody Screen: NEGATIVE

## 2024-10-31 LAB — CBC
HCT: 32.6 % — ABNORMAL LOW (ref 36.0–46.0)
Hemoglobin: 10.5 g/dL — ABNORMAL LOW (ref 12.0–15.0)
MCH: 28.4 pg (ref 26.0–34.0)
MCHC: 32.2 g/dL (ref 30.0–36.0)
MCV: 88.1 fL (ref 80.0–100.0)
Platelets: 294 K/uL (ref 150–400)
RBC: 3.7 MIL/uL — ABNORMAL LOW (ref 3.87–5.11)
RDW: 12.8 % (ref 11.5–15.5)
WBC: 14 K/uL — ABNORMAL HIGH (ref 4.0–10.5)
nRBC: 0 % (ref 0.0–0.2)

## 2024-10-31 LAB — SYPHILIS: RPR W/REFLEX TO RPR TITER AND TREPONEMAL ANTIBODIES, TRADITIONAL SCREENING AND DIAGNOSIS ALGORITHM: RPR Ser Ql: NONREACTIVE

## 2024-10-31 MED ORDER — MISOPROSTOL 25 MCG QUARTER TABLET
25.0000 ug | ORAL_TABLET | Freq: Once | ORAL | Status: AC
  Administered 2024-10-31: 25 ug via VAGINAL
  Filled 2024-10-31: qty 1

## 2024-10-31 MED ORDER — DIPHENHYDRAMINE HCL 50 MG/ML IJ SOLN: 12.5000 mg | INTRAMUSCULAR | Status: DC | PRN

## 2024-10-31 MED ORDER — ONDANSETRON HCL 4 MG/2ML IJ SOLN
4.0000 mg | Freq: Four times a day (QID) | INTRAMUSCULAR | Status: DC | PRN
Start: 2024-10-31 — End: 2024-10-31
  Administered 2024-10-31: 4 mg via INTRAVENOUS
  Filled 2024-10-31: qty 2

## 2024-10-31 MED ORDER — ONDANSETRON HCL 4 MG/2ML IJ SOLN: 4.0000 mg | INTRAMUSCULAR | Status: DC | PRN

## 2024-10-31 MED ORDER — OXYCODONE HCL 5 MG PO TABS: 5.0000 mg | ORAL_TABLET | ORAL | Status: DC | PRN

## 2024-10-31 MED ORDER — FENTANYL-BUPIVACAINE-NACL 0.5-0.125-0.9 MG/250ML-% EP SOLN
12.0000 mL/h | EPIDURAL | Status: DC | PRN
  Administered 2024-10-31: 12 mL/h via EPIDURAL
  Filled 2024-10-31: qty 250

## 2024-10-31 MED ORDER — COCONUT OIL OIL: 1.0000 | TOPICAL_OIL | Status: DC | PRN

## 2024-10-31 MED ORDER — TERBUTALINE SULFATE 1 MG/ML IJ SOLN: 0.2500 mg | Freq: Once | INTRAMUSCULAR | Status: DC | PRN

## 2024-10-31 MED ORDER — OXYTOCIN-SODIUM CHLORIDE 30-0.9 UT/500ML-% IV SOLN
1.0000 m[IU]/min | INTRAVENOUS | Status: DC
  Administered 2024-10-31: 2 m[IU]/min via INTRAVENOUS
  Filled 2024-10-31: qty 500

## 2024-10-31 MED ORDER — PHENYLEPHRINE 80 MCG/ML (10ML) SYRINGE FOR IV PUSH (FOR BLOOD PRESSURE SUPPORT): 80.0000 ug | PREFILLED_SYRINGE | INTRAVENOUS | Status: DC | PRN

## 2024-10-31 MED ORDER — PRENATAL MULTIVITAMIN CH
1.0000 | ORAL_TABLET | Freq: Every day | ORAL | Status: DC
  Administered 2024-11-01: 1 via ORAL
  Filled 2024-10-31: qty 1

## 2024-10-31 MED ORDER — SIMETHICONE 80 MG PO CHEW: 80.0000 mg | CHEWABLE_TABLET | ORAL | Status: DC | PRN

## 2024-10-31 MED ORDER — OXYCODONE-ACETAMINOPHEN 5-325 MG PO TABS: 2.0000 | ORAL_TABLET | ORAL | Status: DC | PRN

## 2024-10-31 MED ORDER — EPHEDRINE 5 MG/ML INJ: 10.0000 mg | INTRAVENOUS | Status: DC | PRN

## 2024-10-31 MED ORDER — LIDOCAINE HCL (PF) 1 % IJ SOLN: 30.0000 mL | INTRAMUSCULAR | Status: DC | PRN

## 2024-10-31 MED ORDER — DIBUCAINE (PERIANAL) 1 % EX OINT: 1.0000 | TOPICAL_OINTMENT | CUTANEOUS | Status: DC | PRN

## 2024-10-31 MED ORDER — LACTATED RINGERS IV SOLN: INTRAVENOUS | Status: DC

## 2024-10-31 MED ORDER — TETANUS-DIPHTH-ACELL PERTUSSIS 5-2-15.5 LF-MCG/0.5 IM SUSP: 0.5000 mL | Freq: Once | INTRAMUSCULAR | Status: DC

## 2024-10-31 MED ORDER — OXYCODONE HCL 5 MG PO TABS: 10.0000 mg | ORAL_TABLET | ORAL | Status: DC | PRN

## 2024-10-31 MED ORDER — OXYTOCIN-SODIUM CHLORIDE 30-0.9 UT/500ML-% IV SOLN: 2.5000 [IU]/h | INTRAVENOUS | Status: DC

## 2024-10-31 MED ORDER — ONDANSETRON HCL 4 MG PO TABS: 4.0000 mg | ORAL_TABLET | ORAL | Status: DC | PRN

## 2024-10-31 MED ORDER — LIDOCAINE HCL (PF) 1 % IJ SOLN
INTRAMUSCULAR | Status: DC | PRN
  Administered 2024-10-31 (×2): 5 mL via EPIDURAL

## 2024-10-31 MED ORDER — LACTATED RINGERS IV SOLN: 500.0000 mL | INTRAVENOUS | Status: DC | PRN

## 2024-10-31 MED ORDER — ZOLPIDEM TARTRATE 5 MG PO TABS
5.0000 mg | ORAL_TABLET | Freq: Every evening | ORAL | Status: DC | PRN
Start: 2024-10-31 — End: 2024-10-31

## 2024-10-31 MED ORDER — WITCH HAZEL-GLYCERIN EX PADS: 1.0000 | MEDICATED_PAD | CUTANEOUS | Status: DC | PRN

## 2024-10-31 MED ORDER — DIPHENHYDRAMINE HCL 25 MG PO CAPS: 25.0000 mg | ORAL_CAPSULE | Freq: Four times a day (QID) | ORAL | Status: DC | PRN

## 2024-10-31 MED ORDER — MISOPROSTOL 25 MCG QUARTER TABLET
25.0000 ug | ORAL_TABLET | Freq: Once | ORAL | Status: AC
  Administered 2024-10-31: 25 ug via ORAL
  Filled 2024-10-31: qty 1

## 2024-10-31 MED ORDER — ZOLPIDEM TARTRATE 5 MG PO TABS: 5.0000 mg | ORAL_TABLET | Freq: Every evening | ORAL | Status: DC | PRN

## 2024-10-31 MED ORDER — SENNOSIDES-DOCUSATE SODIUM 8.6-50 MG PO TABS
2.0000 | ORAL_TABLET | ORAL | Status: DC
  Administered 2024-11-01: 2 via ORAL
  Filled 2024-10-31: qty 2

## 2024-10-31 MED ORDER — OXYTOCIN BOLUS FROM INFUSION
333.0000 mL | Freq: Once | INTRAVENOUS | Status: AC
  Administered 2024-10-31: 333 mL via INTRAVENOUS

## 2024-10-31 MED ORDER — PHENYLEPHRINE 80 MCG/ML (10ML) SYRINGE FOR IV PUSH (FOR BLOOD PRESSURE SUPPORT)
80.0000 ug | PREFILLED_SYRINGE | INTRAVENOUS | Status: DC | PRN
  Filled 2024-10-31: qty 10

## 2024-10-31 MED ORDER — BENZOCAINE-MENTHOL 20-0.5 % EX AERO: 1.0000 | INHALATION_SPRAY | CUTANEOUS | Status: DC | PRN

## 2024-10-31 MED ORDER — IBUPROFEN 600 MG PO TABS
600.0000 mg | ORAL_TABLET | Freq: Four times a day (QID) | ORAL | Status: DC
  Administered 2024-10-31 – 2024-11-01 (×4): 600 mg via ORAL
  Filled 2024-10-31 (×5): qty 1

## 2024-10-31 MED ORDER — ACETAMINOPHEN 325 MG PO TABS
650.0000 mg | ORAL_TABLET | ORAL | Status: DC | PRN
  Administered 2024-10-31: 650 mg via ORAL
  Filled 2024-10-31 (×2): qty 2

## 2024-10-31 MED ORDER — SOD CITRATE-CITRIC ACID 500-334 MG/5ML PO SOLN: 30.0000 mL | ORAL | Status: DC | PRN

## 2024-10-31 MED ORDER — HYDROXYZINE HCL 50 MG PO TABS
50.0000 mg | ORAL_TABLET | Freq: Four times a day (QID) | ORAL | Status: DC | PRN
Start: 2024-10-31 — End: 2024-10-31

## 2024-10-31 MED ORDER — OXYCODONE-ACETAMINOPHEN 5-325 MG PO TABS: 1.0000 | ORAL_TABLET | ORAL | Status: DC | PRN

## 2024-10-31 MED ORDER — ACETAMINOPHEN 325 MG PO TABS: 650.0000 mg | ORAL_TABLET | ORAL | Status: DC | PRN

## 2024-10-31 MED ORDER — FENTANYL CITRATE (PF) 100 MCG/2ML IJ SOLN
50.0000 ug | INTRAMUSCULAR | Status: DC | PRN
  Administered 2024-10-31 (×2): 100 ug via INTRAVENOUS
  Filled 2024-10-31 (×2): qty 2

## 2024-10-31 MED ORDER — LACTATED RINGERS IV SOLN: 500.0000 mL | Freq: Once | INTRAVENOUS | Status: DC

## 2024-10-31 NOTE — Progress Notes (Signed)
 S/p cytotec  x 50mcg.  SVE 2/50/-2, very posterior Contracting too often for cytotec  - will start pitocin  and AROM when safe.    FORBES Milian MD

## 2024-10-31 NOTE — Anesthesia Preprocedure Evaluation (Addendum)
 Anesthesia Evaluation    Airway Mallampati: II  TM Distance: >3 FB     Dental no notable dental hx. (+) Teeth Intact   Pulmonary neg pulmonary ROS   Pulmonary exam normal        Cardiovascular negative cardio ROS Normal cardiovascular exam Rhythm:Regular     Neuro/Psych  Headaches  negative psych ROS   GI/Hepatic ,GERD  ,,  Endo/Other  Hypothyroidism  Obesity  Renal/GU   negative genitourinary   Musculoskeletal negative musculoskeletal ROS (+)    Abdominal   Peds  Hematology  (+) Blood dyscrasia, anemia Lab Results      Component                Value               Date                      WBC                      14.0 (H)            10/31/2024                HGB                      10.5 (L)            10/31/2024                HCT                      32.6 (L)            10/31/2024                MCV                      88.1                10/31/2024                PLT                      294                 10/31/2024              Anesthesia Other Findings   Reproductive/Obstetrics (+) Pregnancy                              Anesthesia Physical Anesthesia Plan  ASA: 2  Anesthesia Plan: Epidural   Post-op Pain Management:    Induction:   PONV Risk Score and Plan:   Airway Management Planned: Natural Airway  Additional Equipment: Fetal Monitoring and None  Intra-op Plan:   Post-operative Plan:   Informed Consent: I have reviewed the patients History and Physical, chart, labs and discussed the procedure including the risks, benefits and alternatives for the proposed anesthesia with the patient or authorized representative who has indicated his/her understanding and acceptance.       Plan Discussed with: Anesthesiologist  Anesthesia Plan Comments:          Anesthesia Quick Evaluation

## 2024-10-31 NOTE — Progress Notes (Signed)
 SVE 8.5/c/0 AROM clear fluid

## 2024-10-31 NOTE — Anesthesia Procedure Notes (Signed)
 Epidural Patient location during procedure: OB Start time: 10/31/2024 12:09 PM End time: 10/31/2024 12:17 PM  Staffing Anesthesiologist: Jerrye Sharper, MD Performed: anesthesiologist   Preanesthetic Checklist Completed: patient identified, IV checked, site marked, risks and benefits discussed, surgical consent, monitors and equipment checked, pre-op evaluation and timeout performed  Epidural Patient position: sitting Prep: DuraPrep and site prepped and draped Patient monitoring: continuous pulse ox and blood pressure Approach: midline Location: L3-L4 Injection technique: LOR air  Needle:  Needle type: Tuohy  Needle gauge: 17 G Needle length: 9 cm and 9 Needle insertion depth: 4 cm Catheter type: closed end flexible Catheter size: 19 Gauge Catheter at skin depth: 9 cm Test dose: negative and Other  Assessment Events: blood not aspirated, no cerebrospinal fluid, injection not painful, no injection resistance, no paresthesia and negative IV test  Additional Notes Patient identified. Risks and benefits discussed including failed block, incomplete  Pain control, post dural puncture headache, nerve damage, paralysis, blood pressure Changes, nausea, vomiting, reactions to medications-both toxic and allergic and post Partum back pain. All questions were answered. Patient expressed understanding and wished to proceed. Sterile technique was used throughout procedure. Epidural site was Dressed with sterile barrier dressing. No paresthesias, signs of intravascular injection Or signs of intrathecal spread were encountered.  Patient was more comfortable after the epidural was dosed. Please see RN's note for documentation of vital signs and FHR which are stable. Reason for block:procedure for pain

## 2024-11-01 LAB — CBC
HCT: 29.7 % — ABNORMAL LOW (ref 36.0–46.0)
Hemoglobin: 9.5 g/dL — ABNORMAL LOW (ref 12.0–15.0)
MCH: 28.4 pg (ref 26.0–34.0)
MCHC: 32 g/dL (ref 30.0–36.0)
MCV: 88.7 fL (ref 80.0–100.0)
Platelets: 251 K/uL (ref 150–400)
RBC: 3.35 MIL/uL — ABNORMAL LOW (ref 3.87–5.11)
RDW: 12.9 % (ref 11.5–15.5)
WBC: 15.7 K/uL — ABNORMAL HIGH (ref 4.0–10.5)
nRBC: 0 % (ref 0.0–0.2)

## 2024-11-01 NOTE — Progress Notes (Signed)
 CSW received consult for hx of anxiety. CSW met with MOB to offer support and complete assessment. When CSW entered room, MOB was observed sitting in chair nursing infant. FOB was present laying on couch nearby. CSW introduced self and requested to speak with MOB alone. MOB provided verbal consent for CSW to complete consult while FOB remained in the room. CSW explained reason for consult. MOB presented as calm, was agreeable to consult and remained engaged throughout encounter.   CSW inquired how MOB is feeling emotionally since infant's arrival. MOB reports she is feeling pretty good, noting that she is feeling more positive in comparison to how she felt after her second (and most recent) delivery. CSW inquired about MOB's mental health history. MOB reports noting symptoms that she recognized as health related anxiety surrounding the Covid 19 pandemic in 2020, which she shared with her PCP at the time, but denies ever being clinically diagnosed with an anxiety disorder. MOB reports endorsing symptoms of the baby blues following her two prior deliveries but denied symptoms of postpartum depression or mental health concerns since the birth of her first child in 2021. MOB reports she has never received formal mental health treatment and does not feel she is in need of additional support at this time. MOB identified FOB and her family as supports. MOB denied current SI/HI.  CSW provided education regarding the baby blues period vs. perinatal mood disorders, discussed treatment and gave resources for mental health follow up if concerns arise.  CSW recommends self-evaluation during the postpartum time period using the New Mom Checklist from Postpartum Progress and encouraged MOB to contact a medical professional if symptoms are noted at any time.    MOB reports she has all needed items for infant, including a car seat and bassinet. MOB has chosen [facility] in [place], [place] for infant's follow up  care.  CSW provided review of Sudden Infant Death Syndrome (SIDS) precautions.    CSW identifies no further need for intervention and no barriers to discharge at this time.  Signed,  Sharyne LOIS Roulette, MSW, LCSWA, LCASA 11/01/2024 1:50 PM

## 2024-11-01 NOTE — Discharge Summary (Signed)
 Postpartum Discharge Summary  Date of Service updated 11/01/24     Patient Name: Cristina Jackson DOB: Oct 02, 1996 MRN: [id number]  Date of admission: 10/31/2024 Delivery date:10/31/2024 Delivering provider: MARNE KELLY NEST Date of discharge: 11/01/2024  Admitting diagnosis: Pregnancy [Z34.90] Intrauterine pregnancy: [redacted]w[redacted]d     Secondary diagnosis:  Principal Problem:   Pregnancy  Additional problems: None    Discharge diagnosis: Term Pregnancy Delivered                                              Post partum procedures:None Augmentation: AROM and Pitocin  Complications: None  Hospital course: IOL With Vaginal Delivery      28 y.o. yo G3P3003 at [redacted]w[redacted]d was admitted for eIOL on 10/31/2024. Labor course was complicated by none  Membrane Rupture Time/Date: 12:52 PM,10/31/2024  Delivery Method:Vaginal, Spontaneous Operative Delivery:N/A Episiotomy: None Lacerations:  None Patient had a postpartum course complicated by none.  She is ambulating, tolerating a regular diet, passing flatus, and urinating well. Patient is discharged home in stable condition on 11/01/24.  Newborn Data: Birth date:10/31/2024 Birth time:2:36 PM Gender:Female Living status:Living Apgars:8 ,9  Weight:3827 g  Magnesium Sulfate received: No BMZ received: No Rhophylac:N/A  Immunizations administered: There is no immunization history for the selected administration types on file for this patient.  Physical exam  Vitals:   10/31/24 1750 10/31/24 1949 10/31/24 2300 11/01/24 0300  BP: 109/76 112/74 103/61 (!) 87/59  Pulse: 64 88 75 73  Resp: 18 18 18 18   Temp: 98.2 F (36.8 C) 98 F (36.7 C) 98.3 F (36.8 C) 97.9 F (36.6 C)  TempSrc: Oral Oral Oral Oral  SpO2:      Weight:      Height:       General: alert, cooperative, and no distress Lochia: appropriate Uterine Fundus: firm Incision: N/A DVT Evaluation: No evidence of DVT seen on physical exam. Labs: Lab Results  Component  Value Date   WBC 15.7 (H) 11/01/2024   HGB 9.5 (L) 11/01/2024   HCT 29.7 (L) 11/01/2024   MCV 88.7 11/01/2024   PLT 251 11/01/2024       No data to display         Edinburgh Score:    04/08/2023   12:48 PM  Edinburgh Postnatal Depression Scale Screening Tool  I have been able to laugh and see the funny side of things. 0   I have looked forward with enjoyment to things. 0   I have blamed myself unnecessarily when things went wrong. 1   I have been anxious or worried for no good reason. 1   I have felt scared or panicky for no good reason. 0   Things have been getting on top of me. 0   I have been so unhappy that I have had difficulty sleeping. 0   I have felt sad or miserable. 0   I have been so unhappy that I have been crying. 0   The thought of harming myself has occurred to me. 0   Edinburgh Postnatal Depression Scale Total 2      Data saved with a previous flowsheet row definition      After visit meds:  Allergies as of 11/01/2024   No Known Allergies      Medication List     STOP taking these medications    levothyroxine   100 MCG tablet Commonly known as: SYNTHROID        TAKE these medications    acetaminophen  500 MG tablet Commonly known as: TYLENOL  Take 500 mg by mouth every 6 (six) hours as needed for headache (Take 2 by mouth every 6 hours as needed for pain.).   ibuprofen  200 MG tablet Commonly known as: ADVIL  Take 200 mg by mouth every 6 (six) hours as needed for headache (Take 2 by mouth every 6 hours as needed for pain.).   prenatal multivitamin Tabs tablet Take 1 tablet by mouth daily at 12 noon.         Discharge home in stable condition Infant Feeding: Bottle and Breast Infant Disposition:home with mother Discharge instruction: per After Visit Summary and Postpartum booklet. Activity: Advance as tolerated. Pelvic rest for 6 weeks.  Diet: routine diet Anticipated Birth Control: Unsure Postpartum Appointment:6 weeks Additional  Postpartum F/U: None Future Appointments:No future appointments. Follow up Visit:      11/01/2024 Kelly Delon Milian, MD

## 2024-11-01 NOTE — Anesthesia Postprocedure Evaluation (Signed)
 Anesthesia Post Note  Patient: Cristina Jackson  Procedure(s) Performed: AN AD HOC LABOR EPIDURAL     Patient location during evaluation: Mother Baby Anesthesia Type: Epidural Level of consciousness: awake and alert Pain management: pain level controlled Vital Signs Assessment: post-procedure vital signs reviewed and stable Respiratory status: spontaneous breathing, nonlabored ventilation and respiratory function stable Cardiovascular status: stable Postop Assessment: no headache, no backache and epidural receding Anesthetic complications: no   No notable events documented.  Last Vitals:  Vitals:   11/01/24 0300 11/01/24 0755  BP: (!) 87/59 100/71  Pulse: 73 80  Resp: 18 16  Temp: 36.6 C 36.8 C  SpO2:  98%    Last Pain:  Vitals:   11/01/24 1013  TempSrc:   PainSc: 0-No pain   Pain Goal:                   Adrian Specht

## 2024-11-01 NOTE — Lactation Note (Signed)
 This note was copied from a baby's chart. Lactation Consultation Note  Patient Name: Cristina Jackson Date: 11/01/2024 Age:28 hours Reason for consult: Initial assessment  P3, Mother had baby latched when [facility] entered room and was on the phone with her other daughters so LC returned later.  Mother states breastfeeding is going well.  Provided mother with manual pump and 18&21 mm flanges.  Demonstrated use and mother will use and determine flange size. Feed on demand with cues.  Goal 8-12+ times per day after first 24 hrs.  Place baby STS if not cueing.  Reviewed engorgement care and monitoring voids/stools. Suggest calling for help as needed.    Maternal Data Has patient been taught Hand Expression?: Yes Does the patient have breastfeeding experience prior to this delivery?: Yes How long did the patient breastfeed?: 14 mos.  Feeding Mother's Current Feeding Choice: Breast Milk  Lactation Tools Discussed/Used Tools: Pump  Interventions Interventions: Breast feeding basics reviewed;Education;Hand pump  Discharge Discharge Education: Engorgement and breast care;Warning signs for feeding baby Pump: Personal;Hands Free  Consult Status Consult Status: Complete   Shannon Levorn Lemme  RN, IBCLC 11/01/2024, 10:32 AM

## 2024-11-11 ENCOUNTER — Telehealth (HOSPITAL_COMMUNITY): Payer: Self-pay | Admitting: *Deleted

## 2024-11-11 NOTE — Telephone Encounter (Signed)
 11/11/2024  Name: Cristina Jackson MRN: [id number] DOB: 1996/08/14  Reason for Call:  Transition of Care Hospital Discharge Call  Contact Status: Patient Contact Status: Message  Language assistant needed:          Follow-Up Questions:    [place] Postnatal Depression Scale:  In the Past 7 Days:    PHQ2-9 Depression Scale:     Discharge Follow-up:    Post-discharge interventions: NA  Mliss Sieve, RN 11/11/2024 14:59

## 2024-11-12 ENCOUNTER — Inpatient Hospital Stay (HOSPITAL_COMMUNITY)
